# Patient Record
Sex: Male | Born: 1995 | Race: White | Hispanic: No | Smoking: Never smoker
Health system: Southern US, Community
[De-identification: ages and names within clinical notes are randomized; demographics above are authoritative.]

## PROBLEM LIST (undated history)

## (undated) DIAGNOSIS — I471 Supraventricular tachycardia, unspecified: Secondary | ICD-10-CM

## (undated) HISTORY — DX: Supraventricular tachycardia: I47.1

## (undated) HISTORY — PX: OTHER SURGICAL HISTORY: SHX169

## (undated) HISTORY — DX: Supraventricular tachycardia, unspecified: I47.10

---
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
## 2016-09-28 ENCOUNTER — Other Ambulatory Visit (HOSPITAL_COMMUNITY): Payer: Self-pay | Admitting: Internal Medicine

## 2016-09-28 DIAGNOSIS — R51 Headache: Principal | ICD-10-CM

## 2016-09-28 DIAGNOSIS — R519 Headache, unspecified: Secondary | ICD-10-CM

## 2016-10-06 ENCOUNTER — Ambulatory Visit (HOSPITAL_COMMUNITY): Payer: Self-pay

## 2018-04-18 ENCOUNTER — Other Ambulatory Visit (HOSPITAL_COMMUNITY): Payer: Self-pay | Admitting: Family Medicine

## 2018-04-18 ENCOUNTER — Ambulatory Visit (HOSPITAL_COMMUNITY)
Admission: RE | Admit: 2018-04-18 | Discharge: 2018-04-18 | Disposition: A | Discharge status: Home / Self Care | Payer: Managed Care, Other (non HMO) | Source: Ambulatory Visit | Attending: Family Medicine | Admitting: Family Medicine

## 2018-04-18 DIAGNOSIS — M542 Cervicalgia: Secondary | ICD-10-CM

## 2018-04-18 DIAGNOSIS — Z1389 Encounter for screening for other disorder: Secondary | ICD-10-CM | POA: Insufficient documentation

## 2018-04-18 DIAGNOSIS — K589 Irritable bowel syndrome without diarrhea: Secondary | ICD-10-CM | POA: Diagnosis present

## 2018-06-11 ENCOUNTER — Ambulatory Visit: Payer: Managed Care, Other (non HMO) | Admitting: Physician Assistant

## 2018-06-12 ENCOUNTER — Ambulatory Visit: Payer: Managed Care, Other (non HMO) | Admitting: Gastroenterology

## 2018-06-22 ENCOUNTER — Ambulatory Visit: Payer: Managed Care, Other (non HMO) | Admitting: Nurse Practitioner

## 2018-06-22 ENCOUNTER — Encounter (INDEPENDENT_AMBULATORY_CARE_PROVIDER_SITE_OTHER): Payer: Self-pay

## 2018-06-22 ENCOUNTER — Encounter: Payer: Self-pay | Admitting: Nurse Practitioner

## 2018-06-22 VITALS — BP 90/60 | HR 86 | Ht 70.5 in | Wt 124.0 lb

## 2018-06-22 DIAGNOSIS — R110 Nausea: Secondary | ICD-10-CM | POA: Diagnosis not present

## 2018-06-22 MED ORDER — ONDANSETRON HCL 4 MG PO TABS: 4.0000 mg | ORAL_TABLET | Freq: Three times a day (TID) | ORAL | 1 refills | Status: DC | PRN

## 2018-06-22 NOTE — Progress Notes (Addendum)
Chief Complaint: chronic nausea  Referring Provider:  Audrea Muscat, MD      ASSESSMENT AND PLAN;   1. 22 yo male with chronic ( 5 years) of nausea. EGD at [facility] in 2015 revealing of bile in stomach and chronic gastropathy. He has early satiety without weight loss but also unable to gain.  -Will arrange for gastric emptying scan to evaluate for gastroparesis.  -trial of zofran 4 mg as needed.  -If scan negative then consider repeating EGD, it has been several years since last one.   2. [facility] of celiac disease. Patient says PCP checked him for celiac and it was negative.  -he will get copy of celiac labs from PCP   Addendum: Records reviewed from Dr. Assunta Found.  Office visit 04/18/18 for abdominal pain. Patient felt to have irritable bowel syndrome.Liver test unremarkable.  Lipase unremarkable.  Referred to GI  HPI:    Patient is a 22 year old male with a hx of SVT, s/p ablation in 2015 at [facility]. No cardiac issues since. Patient here with his mother for evaluation of nausea at request of PCP.     Patient gives a history of chronic nausea, since age 55. Never vomits. Feels best in am upon rising then starts to get nauseated when up moving around. Nausea increases with PO intake. Soft drinks make the nausea worse as does [place] food. Other than that it doesn't matter what he eats. Has never been able to gain weight. Can't complete meals due to early satiety. No abdominal pain but he does burp a lot. No heartburn. BMs are at baseline. No rectal bleeding. He doesn't take any medication, prescription or OTC. He doesn't smoke, no ETOH. No THC. He had an EGD at [facility] in 2015 for evaluation of abdominal pain. There was bile in stomach and chronic gastropathy   Past Medical History:  Diagnosis Date  . SVT (supraventricular tachycardia) (HCC)     Past Surgical History:  Procedure Laterality Date  . heart ablation     Family History  Problem Relation Age of Onset  . Colon  cancer Paternal Aunt   . Diabetes Paternal Aunt   . Celiac disease Paternal Uncle    Social History   Tobacco Use  . Smoking status: Never Smoker  Substance Use Topics  . Alcohol use: Never    Frequency: Never  . Drug use: Never   No current outpatient medications on file.   No current facility-administered medications for this visit.    No Known Allergies   Review of Systems: All systems reviewed and negative except where noted in HPI.   Creatinine clearance cannot be calculated (No order found.)   Physical Exam:    Wt Readings from Last 3 Encounters:  06/22/18 124 lb (56.2 kg)    BP 90/60   Pulse 86   Ht 5' 10.5" (1.791 m)   Wt 124 lb (56.2 kg)   BMI 17.54 kg/m  Constitutional:  Pleasant thin male in no acute distress. Psychiatric: Normal mood and affect. Behavior is normal. EENT: Pupils normal.  Conjunctivae are normal. No scleral icterus. Neck supple.  Cardiovascular: Normal rate, regular rhythm. No edema Pulmonary/chest: Effort normal and breath sounds normal. No wheezing, rales or rhonchi. Abdominal: Soft, nondistended, nontender. Bowel sounds active throughout. There are no masses palpable. No hepatomegaly. Neurological: Alert and oriented to person place and time. Skin: Skin is warm and dry. No rashes noted.  Willette Cluster, NP  06/22/2018, 2:30 PM  Cc:  Audrea MuscatJohn Golden, MD

## 2018-06-22 NOTE — Patient Instructions (Signed)
If you are age 21 or older, your body mass index should be between 23-30. Your Body mass index is 17.54 kg/m. If this is out of the aforementioned range listed, please consider follow up with your Primary Care Provider.  If you are age 21 or younger, your body mass index should be between 19-25. Your Body mass index is 17.54 kg/m. If this is out of the aformentioned range listed, please consider follow up with your Primary Care Provider.   You have been scheduled for a gastric emptying scan at [facility] on 07/04/18 at 7:30 am. Please arrive at least 15 minutes prior to your appointment for registration. Please make certain not to have anything to eat or drink after midnight the night before your test. Hold all stomach medications (ex: Zofran, phenergan, Reglan) 48 hours prior to your test. If you need to reschedule your appointment, please contact radiology scheduling at [phone]. _____________________________________________________________________ A gastric-emptying study measures how long it takes for food to move through your stomach. There are several ways to measure stomach emptying. In the most common test, you eat food that contains a small amount of radioactive material. A scanner that detects the movement of the radioactive material is placed over your abdomen to monitor the rate at which food leaves your stomach. This test normally takes about 4 hours to complete. _____________________________________________________________________  We have sent the following medications to your pharmacy for you to pick up at your convenience: Zofran  Thank you for choosing me and [facility].   Willette ClusterPaula Guenther, NP

## 2018-06-25 NOTE — Progress Notes (Signed)
Agree with assessment and plan as outlined. He may wish to take Zofran q HS and see if this helps him in the AM. Would try scheduled Zofran a few times per day to see if this helps minimize his symptoms.

## 2018-07-04 ENCOUNTER — Ambulatory Visit (HOSPITAL_COMMUNITY): Payer: Managed Care, Other (non HMO)

## 2018-07-11 ENCOUNTER — Other Ambulatory Visit: Payer: Self-pay | Admitting: Nurse Practitioner

## 2019-04-16 ENCOUNTER — Encounter: Payer: Self-pay | Admitting: Gastroenterology

## 2019-04-16 ENCOUNTER — Ambulatory Visit (INDEPENDENT_AMBULATORY_CARE_PROVIDER_SITE_OTHER): Payer: Self-pay | Admitting: Gastroenterology

## 2019-04-16 ENCOUNTER — Other Ambulatory Visit: Payer: Self-pay

## 2019-04-16 VITALS — Ht 70.0 in | Wt 124.0 lb

## 2019-04-16 DIAGNOSIS — R110 Nausea: Secondary | ICD-10-CM

## 2019-04-16 NOTE — Patient Instructions (Addendum)
We will arrange gastric emptying scan for chronic nausea.  He is at work currently and may not be able to answer his cell phone immediately but he will call back as soon as he can if a voicemail was left.  You have been scheduled for a gastric emptying scan at [facility] on 04/24/19 at 1:30pm. Please arrive at least 15 minutes prior to your appointment for registration. Please make certain not to have anything to eat or drink after midnight the night before your test. Hold all stomach medications (ex: Zofran, phenergan, Reglan) 48 hours prior to your test. If you need to reschedule your appointment, please contact radiology scheduling at [phone]. _____________________________________________________________________ A gastric-emptying study measures how long it takes for food to move through your stomach. There are several ways to measure stomach emptying. In the most common test, you eat food that contains a small amount of radioactive material. A scanner that detects the movement of the radioactive material is placed over your abdomen to monitor the rate at which food leaves your stomach. This test normally takes about 4 hours to complete. _____________________________________________________________________

## 2019-04-16 NOTE — Progress Notes (Signed)
This service was provided via virtual visit.  Both audio and visual were used.  The patient was located at home.  I was located in my office.  The patient did consent to this virtual visit and is aware of possible charges through their insurance for this visit.  The patient is an established patient.  My certified medical assistant, Barbaraann Rondo, contributed to this visit by contacting the patient by phone 1 or 2 business days prior to the appointment and also followed up on the recommendations I made after the visit.  Time spent on virtual visit: 24 min   HPI: This is a very pleasant 23 year old man whom I am meeting for the first time today.  This is via telemedicine visit.   EGD at [facility] in 2015 for evaluation of abdominal pain. There was bile in stomach and chronic gastropathy   He was seen here in our office July 2019 by Willette Cluster, staffed by Dr. Adela Lank.  This was for chronic nausea that has been going on for at least 6 years.  She recommended and ordered a gastric emptying scan.  Their plan was to proceed with EGD if the gastric emptying scan was unhelpful.  He never underwent the gastric emptying scan.    He is still having problems with nausea, never vomits.  Sometimes this occurs with eating sometimes not with eating.  Can   Only very rarely drinks etoh.  Overall his weight has been stable.  Very rarely needs NSAIDs, 1-2 times per month.  He's been on several different meds for his stomach, doesn't recall any of the names.  His uncle has celiac.  He was himself tested for celiac by blood tests  No DM.  Not on illicit drugs.  No abdominal pains at all.  He has no real issues with his bowels, does have to strain at times but this seems to be fairly minor.  Chief complaint is chronic nausea  ROS: complete GI ROS as described in HPI, all other review negative.  Constitutional:  No unintentional weight loss   Past Medical History:  Diagnosis Date  . SVT  (supraventricular tachycardia) (HCC)     Past Surgical History:  Procedure Laterality Date  . heart ablation      No current outpatient medications on file.   No current facility-administered medications for this visit.     Allergies as of 04/16/2019  . (No Known Allergies)    Family History  Problem Relation Age of Onset  . Colon cancer Paternal Aunt   . Diabetes Paternal Aunt   . Celiac disease Paternal Uncle     Social History   Socioeconomic History  . Marital status: Single    Spouse name: Not on file  . Number of children: Not on file  . Years of education: Not on file  . Highest education level: Not on file  Occupational History  . Not on file  Social Needs  . Financial resource strain: Not on file  . Food insecurity:    Worry: Not on file    Inability: Not on file  . Transportation needs:    Medical: Not on file    Non-medical: Not on file  Tobacco Use  . Smoking status: Never Smoker  . Smokeless tobacco: Never Used  Substance and Sexual Activity  . Alcohol use: Never    Frequency: Never  . Drug use: Never  . Sexual activity: Not on file  Lifestyle  . Physical activity:  Days per week: Not on file    Minutes per session: Not on file  . Stress: Not on file  Relationships  . Social connections:    Talks on phone: Not on file    Gets together: Not on file    Attends religious service: Not on file    Active member of club or organization: Not on file    Attends meetings of clubs or organizations: Not on file    Relationship status: Not on file  . Intimate partner violence:    Fear of current or ex partner: Not on file    Emotionally abused: Not on file    Physically abused: Not on file    Forced sexual activity: Not on file  Other Topics Concern  . Not on file  Social History Narrative  . Not on file     Physical Exam: Unable to perform because this was a "telemed visit" due to current Covid-19 pandemic  Assessment and plan: 23 y.o.  male with chronic nausea  He has had chronic nausea for 6 or 7 years.  No abdominal pains and he only very rarely vomits.  Celiac sprue runs in his family, his uncle has it.  He tells me he was tested for by blood tests within the past year or so.  I do not see any of those results in epic or through care everywhere.  He had an upper endoscopy 5 years ago.  Bile was seen.  He was not given any real explanation of his symptoms or treatment recommendations from what he recalls however.  He has not been losing weight.  He is thin now and has always been thin.  He was seen for these exact complaints here about a year ago in our office.  He was recommended to have a gastric emptying scan however never went through with that.  I recommend the same thing for him to start his work-up now.  Hopefully he will follow through.  If it is not helpful then he will probably need repeat upper endoscopy to examine for ulcer disease, H. pylori infection, probably biopsy his upper intestine for pathologic evidence of sprue.  Please see the "Patient Instructions" section for addition details about the plan.  Rob Buntinganiel Jezlyn Westerfield, MD [facility] 04/16/2019, 10:28 AM

## 2019-04-24 ENCOUNTER — Other Ambulatory Visit: Payer: Self-pay

## 2019-04-24 ENCOUNTER — Encounter (HOSPITAL_COMMUNITY)
Admission: RE | Admit: 2019-04-24 | Discharge: 2019-04-24 | Disposition: A | Discharge status: Home / Self Care | Payer: 59 | Source: Ambulatory Visit | Attending: Gastroenterology | Admitting: Gastroenterology

## 2019-04-24 DIAGNOSIS — R110 Nausea: Secondary | ICD-10-CM | POA: Insufficient documentation

## 2019-04-24 MED ORDER — TECHNETIUM TC 99M SULFUR COLLOID: 2.1000 | Freq: Once | INTRAVENOUS | Status: DC | PRN

## 2019-04-25 ENCOUNTER — Other Ambulatory Visit: Payer: Self-pay | Admitting: Gastroenterology

## 2019-04-25 DIAGNOSIS — R110 Nausea: Secondary | ICD-10-CM

## 2019-04-30 ENCOUNTER — Encounter: Payer: Self-pay | Admitting: Gastroenterology

## 2019-05-03 ENCOUNTER — Encounter: Payer: 59 | Admitting: Gastroenterology

## 2019-05-08 ENCOUNTER — Telehealth: Payer: Self-pay | Admitting: *Deleted

## 2019-05-08 NOTE — Telephone Encounter (Signed)
LMOM will call back. 

## 2019-05-08 NOTE — Telephone Encounter (Signed)
Pt returned your call.  

## 2019-05-08 NOTE — Telephone Encounter (Signed)

## 2019-05-10 ENCOUNTER — Ambulatory Visit (AMBULATORY_SURGERY_CENTER): Payer: 59 | Admitting: Gastroenterology

## 2019-05-10 ENCOUNTER — Other Ambulatory Visit: Payer: Self-pay

## 2019-05-10 ENCOUNTER — Encounter: Payer: Self-pay | Admitting: Gastroenterology

## 2019-05-10 VITALS — BP 101/59 | HR 43 | Temp 97.7°F | Resp 18 | Ht 70.0 in | Wt 124.0 lb

## 2019-05-10 DIAGNOSIS — K2990 Gastroduodenitis, unspecified, without bleeding: Secondary | ICD-10-CM

## 2019-05-10 DIAGNOSIS — K2970 Gastritis, unspecified, without bleeding: Secondary | ICD-10-CM | POA: Diagnosis not present

## 2019-05-10 DIAGNOSIS — R110 Nausea: Secondary | ICD-10-CM

## 2019-05-10 MED ORDER — SODIUM CHLORIDE 0.9 % IV SOLN: 500.0000 mL | Freq: Once | INTRAVENOUS | Status: DC

## 2019-05-10 NOTE — Progress Notes (Signed)
PT taken to PACU. Monitors in place. VSS. Report given to RN. 

## 2019-05-10 NOTE — Progress Notes (Signed)
No problems noted in the recovery room. maw 

## 2019-05-10 NOTE — Progress Notes (Signed)
Pt's states no medical or surgical changes since previsit or office visit. 

## 2019-05-10 NOTE — Progress Notes (Signed)
[place], [place]- Vitals/temp

## 2019-05-10 NOTE — Patient Instructions (Signed)
YOU HAD AN ENDOSCOPIC PROCEDURE TODAY AT THE [facility]:   Refer to the procedure report that was given to you for any specific questions about what was found during the examination.  If the procedure report does not answer your questions, please call your gastroenterologist to clarify.  If you requested that your care partner not be given the details of your procedure findings, then the procedure report has been included in a sealed envelope for you to review at your convenience later.  YOU SHOULD EXPECT: Some feelings of bloating in the abdomen. Passage of more gas than usual.  Walking can help get rid of the air that was put into your GI tract during the procedure and reduce the bloating. If you had a lower endoscopy (such as a colonoscopy or flexible sigmoidoscopy) you may notice spotting of blood in your stool or on the toilet paper. If you underwent a bowel prep for your procedure, you may not have a normal bowel movement for a few days.  Please Note:  You might notice some irritation and congestion in your nose or some drainage.  This is from the oxygen used during your procedure.  There is no need for concern and it should clear up in a day or so.  SYMPTOMS TO REPORT IMMEDIATELY:     Following upper endoscopy (EGD)  Vomiting of blood or coffee ground material  New chest pain or pain under the shoulder blades  Painful or persistently difficult swallowing  New shortness of breath  Fever of 100F or higher  Black, tarry-looking stools  For urgent or emergent issues, a gastroenterologist can be reached at any hour by calling [phone].   DIET:  We do recommend a small meal at first, but then you may proceed to your regular diet.  Drink plenty of fluids but you should avoid alcoholic beverages for 24 hours.  ACTIVITY:  You should plan to take it easy for the rest of today and you should NOT DRIVE or use heavy machinery until tomorrow (because of the sedation medicines  used during the test).    FOLLOW UP: Our staff will call the number listed on your records 48-72 hours following your procedure to check on you and address any questions or concerns that you may have regarding the information given to you following your procedure. If we do not reach you, we will leave a message.  We will attempt to reach you two times.  During this call, we will ask if you have developed any symptoms of COVID 19. If you develop any symptoms (ie: fever, flu-like symptoms, shortness of breath, cough etc.) before then, please call [phone].  If you test positive for Covid 19 in the 2 weeks post procedure, please call and report this information to us.    If any biopsies were taken you will be contacted by phone or by letter within the next 1-3 weeks.  Please call us at [phone] if you have not heard about the biopsies in 3 weeks.    SIGNATURES/CONFIDENTIALITY: You and/or your care partner have signed paperwork which will be entered into your electronic medical record.  These signatures attest to the fact that that the information above on your After Visit Summary has been reviewed and is understood.  Full responsibility of the confidentiality of this discharge information lies with you and/or your care-partner.    Handout was given to your care partner on Gastritis. You may resume your current medications today. Await biopsy  results. Please call if any questions or concerns.

## 2019-05-10 NOTE — Op Note (Signed)
[facility] Patient Name: Jeffrey Jarvis Procedure Date: 05/10/2019 12:56 PM MRN: [id number] Endoscopist: Rachael Fee , MD Age: 23 Referring MD:  Date of Birth: [birth year] Gender: Male Account #: [id number] Procedure:                Upper GI endoscopy Indications:              Nausea Medicines:                Monitored Anesthesia Care Procedure:                Pre-Anesthesia Assessment:                           - Prior to the procedure, a History and Physical                            was performed, and patient medications and                            allergies were reviewed. The patient's tolerance of                            previous anesthesia was also reviewed. The risks                            and benefits of the procedure and the sedation                            options and risks were discussed with the patient.                            All questions were answered, and informed consent                            was obtained. Prior Anticoagulants: The patient has                            taken no previous anticoagulant or antiplatelet                            agents. ASA Grade Assessment: I - A normal, healthy                            patient. After reviewing the risks and benefits,                            the patient was deemed in satisfactory condition to                            undergo the procedure.                           After obtaining informed consent, the endoscope was  passed under direct vision. Throughout the                            procedure, the patient's blood pressure, pulse, and                            oxygen saturations were monitored continuously. The                            Endoscope was introduced through the mouth, and                            advanced to the second part of duodenum. The upper                            GI endoscopy was accomplished without difficulty.                  The patient tolerated the procedure well. Scope In: Scope Out: Findings:                 Mild inflammation characterized by erythema and                            friability was found in the gastric antrum.                            Biopsies were taken with a cold forceps for                            histology.                           The exam was otherwise without abnormality.                           The duodenum was biopsied and sent to path. Complications:            No immediate complications. Estimated blood loss:                            None. Estimated Blood Loss:     Estimated blood loss: none. Impression:               - Gastritis. Biopsied to check for H. pylori.                           - The examination was otherwise normal.                           - The normal appearing duodenum was biopsied to                            check for Celiac Sprue. Recommendation:           - Patient has a contact number available for  emergencies. The signs and symptoms of potential                            delayed complications were discussed with the                            patient. Return to normal activities tomorrow.                            Written discharge instructions were provided to the                            patient.                           - Resume previous diet.                           - Continue present medications.                           - Await pathology results. Would consider                            endocrinology referral. Rachael Feeaniel P Jacobs, MD 05/10/2019 1:10:35 PM This report has been signed electronically.

## 2019-05-14 ENCOUNTER — Telehealth: Payer: Self-pay

## 2019-05-14 NOTE — Telephone Encounter (Signed)
Left message on 2nd follow up call. 

## 2019-05-14 NOTE — Telephone Encounter (Signed)
Left message on follow up call. 

## 2019-05-20 ENCOUNTER — Other Ambulatory Visit: Payer: Self-pay

## 2019-05-20 MED ORDER — ONDANSETRON HCL 4 MG PO TABS: 4.0000 mg | ORAL_TABLET | Freq: Two times a day (BID) | ORAL | 11 refills | Status: DC

## 2019-07-01 ENCOUNTER — Telehealth: Payer: Self-pay | Admitting: General Surgery

## 2019-07-01 NOTE — Telephone Encounter (Signed)
error 

## 2019-07-01 NOTE — Telephone Encounter (Signed)
Called the patient to Covid screen for in person visit on 07/02/2019. The patient stated he needed to cancel this appointment and he will call back to reschedule.

## 2019-07-02 ENCOUNTER — Ambulatory Visit: Payer: 59 | Admitting: Gastroenterology

## 2019-12-19 ENCOUNTER — Ambulatory Visit: Payer: 59 | Attending: Internal Medicine

## 2019-12-19 ENCOUNTER — Other Ambulatory Visit: Payer: Self-pay

## 2019-12-19 DIAGNOSIS — Z20822 Contact with and (suspected) exposure to covid-19: Secondary | ICD-10-CM | POA: Diagnosis not present

## 2019-12-21 LAB — NOVEL CORONAVIRUS, NAA: SARS-CoV-2, NAA: NOT DETECTED

## 2020-04-29 DIAGNOSIS — J343 Hypertrophy of nasal turbinates: Secondary | ICD-10-CM | POA: Diagnosis not present

## 2020-04-29 DIAGNOSIS — Z8719 Personal history of other diseases of the digestive system: Secondary | ICD-10-CM | POA: Diagnosis not present

## 2020-06-04 DIAGNOSIS — R609 Edema, unspecified: Secondary | ICD-10-CM | POA: Diagnosis not present

## 2020-06-12 DIAGNOSIS — K1120 Sialoadenitis, unspecified: Secondary | ICD-10-CM | POA: Diagnosis not present

## 2020-06-12 DIAGNOSIS — K115 Sialolithiasis: Secondary | ICD-10-CM | POA: Diagnosis not present

## 2020-06-12 DIAGNOSIS — R221 Localized swelling, mass and lump, neck: Secondary | ICD-10-CM | POA: Diagnosis not present

## 2020-07-03 DIAGNOSIS — K115 Sialolithiasis: Secondary | ICD-10-CM | POA: Diagnosis not present

## 2020-09-17 ENCOUNTER — Other Ambulatory Visit: Payer: Self-pay

## 2020-09-17 ENCOUNTER — Other Ambulatory Visit: Payer: 59

## 2020-09-17 DIAGNOSIS — Z20822 Contact with and (suspected) exposure to covid-19: Secondary | ICD-10-CM | POA: Diagnosis not present

## 2020-09-18 LAB — SPECIMEN STATUS REPORT

## 2020-09-18 LAB — SARS-COV-2, NAA 2 DAY TAT

## 2020-09-18 LAB — NOVEL CORONAVIRUS, NAA: SARS-CoV-2, NAA: NOT DETECTED

## 2021-02-19 DIAGNOSIS — R2241 Localized swelling, mass and lump, right lower limb: Secondary | ICD-10-CM | POA: Diagnosis not present

## 2021-02-19 DIAGNOSIS — M67471 Ganglion, right ankle and foot: Secondary | ICD-10-CM | POA: Diagnosis not present

## 2021-05-06 ENCOUNTER — Other Ambulatory Visit: Payer: Self-pay

## 2021-05-06 ENCOUNTER — Ambulatory Visit
Admission: EM | Admit: 2021-05-06 | Discharge: 2021-05-06 | Disposition: A | Discharge status: Home / Self Care | Payer: 59 | Attending: Internal Medicine | Admitting: Internal Medicine

## 2021-05-06 DIAGNOSIS — T148XXA Other injury of unspecified body region, initial encounter: Secondary | ICD-10-CM

## 2021-05-06 DIAGNOSIS — Z23 Encounter for immunization: Secondary | ICD-10-CM

## 2021-05-06 MED ORDER — CIPROFLOXACIN HCL 500 MG PO TABS: 500.0000 mg | ORAL_TABLET | Freq: Two times a day (BID) | ORAL | 0 refills | Status: DC

## 2021-05-06 MED ORDER — TETANUS-DIPHTH-ACELL PERTUSSIS 5-2.5-18.5 LF-MCG/0.5 IM SUSY
0.5000 mL | PREFILLED_SYRINGE | Freq: Once | INTRAMUSCULAR | Status: AC
  Administered 2021-05-06: 0.5 mL via INTRAMUSCULAR

## 2021-05-06 NOTE — ED Triage Notes (Signed)
Stepped on nail with left foot today.  Unsure of last tetanus shot.

## 2021-05-06 NOTE — ED Provider Notes (Signed)
[facility]    CSN: [id number] Arrival date & time: 05/06/21  2458      History   Chief Complaint No chief complaint on file.   HPI Jeffrey Jarvis is a 25 y.o. male who stepped on a rusted nail while at home and caused a lot of bleeding of L foot. Does not recall when his last TD was. 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 He was wearing work boots. He washed it at home in salt water.     Past Medical History:  Diagnosis Date  . SVT (supraventricular tachycardia) (HCC)     There are no problems to display for this patient.   Past Surgical History:  Procedure Laterality Date  . heart ablation         Home Medications    Prior to Admission medications   Medication Sig Start Date End Date Taking? Authorizing Provider  ciprofloxacin (CIPRO) 500 MG tablet Take 1 tablet (500 mg total) by mouth 2 (two) times daily. 05/06/21  Yes Rodriguez-Southworth, Nettie Elm, PA-C    Family History Family History  Problem Relation Age of Onset  . Colon cancer Paternal Aunt   . Diabetes Paternal Aunt   . Celiac disease Paternal Uncle     Social History Social History   Tobacco Use  . Smoking status: Never Smoker  . Smokeless tobacco: Never Used  Vaping Use  . Vaping Use: Never used  Substance Use Topics  . Alcohol use: Yes    Comment: occ  . Drug use: Never     Allergies   Patient has no known allergies.   Review of Systems Review of Systems  Musculoskeletal: Positive for gait problem. Negative for arthralgias.  Skin: Positive for wound. Negative for color change, pallor and rash.  Neurological: Negative for numbness.     Physical Exam Triage Vital Signs ED Triage Vitals  Enc Vitals Group     BP 05/06/21 1849 118/66     Pulse Rate 05/06/21 1849 90     Resp 05/06/21 1849 16     Temp 05/06/21 1849 97.7 F (36.5 C)     Temp Source 05/06/21 1849 Oral     SpO2 05/06/21 1849 96 %     Weight --       Height --      Head Circumference --      Peak Flow --      Pain Score 05/06/21 1850 3     Pain Loc --      Pain Edu? --      Excl. in GC? --    No data found.  Updated Vital Signs BP 118/66 (BP Location: Right Arm)   Pulse 90   Temp 97.7 F (36.5 C) (Oral)   Resp 16   SpO2 96%   Visual Acuity Right Eye Distance:   Left Eye Distance:   Bilateral Distance:    Right Eye Near:   Left Eye Near:    Bilateral Near:     Physical Exam Vitals and nursing note reviewed.  Constitutional:      General: He is not in acute distress.    Appearance: He is normal weight. He is not toxic-appearing.  HENT:     Right Ear: External ear normal.     Left Ear: External ear normal.  Eyes:     General: No scleral icterus.    Conjunctiva/sclera: Conjunctivae normal.  Pulmonary:     Effort: Pulmonary effort is normal.  Musculoskeletal:        General: No swelling. Normal range of motion.     Cervical back: Neck supple.  Skin:    General: Skin is warm and dry.     Comments: L FOOT - with clean puncture wound below great toe ball of foot. Does not have bone tenderness   Neurological:     Mental Status: He is alert and oriented to person, place, and time.     Gait: Gait abnormal.  Psychiatric:        Mood and Affect: Mood normal.        Behavior: Behavior normal.        Thought Content: Thought content normal.        Judgment: Judgment normal.      UC Treatments / Results  Labs (all labs ordered are listed, but only abnormal results are displayed) Labs Reviewed - No data to display  EKG   Radiology No results found.  Procedures Procedures (including critical care time)  Medications Ordered in UC Medications  Tdap (BOOSTRIX) injection 0.5 mL (0.5 mLs Intramuscular Given 05/06/21 1855)    Initial Impression / Assessment and Plan / UC Course  I have reviewed the triage vital signs and the nursing notes. TDAP given I placed him on Cipro as noted.  Advised to continue  washing foot in salt water.  I applied a band aid before he left   Final Clinical Impressions(s) / UC Diagnoses   Final diagnoses:  Puncture wound     Discharge Instructions     I am placing you on Cipro antibiotic to cover you for a bacteria called psudomonoas that can come from rubber shoes.     ED Prescriptions    Medication Sig Dispense Auth. Provider   ciprofloxacin (CIPRO) 500 MG tablet Take 1 tablet (500 mg total) by mouth 2 (two) times daily. 14 tablet Rodriguez-Southworth, Nettie Elm, PA-C     PDMP not reviewed this encounter.   Garey Ham, Cordelia Poche 05/06/21 2129

## 2021-05-06 NOTE — Discharge Instructions (Signed)
I am placing you on Cipro antibiotic to cover you for a bacteria called psudomonoas that can come from rubber shoes.

## 2021-12-03 DIAGNOSIS — J069 Acute upper respiratory infection, unspecified: Secondary | ICD-10-CM | POA: Diagnosis not present

## 2024-02-22 ENCOUNTER — Other Ambulatory Visit (HOSPITAL_COMMUNITY): Payer: Self-pay | Admitting: Internal Medicine

## 2024-02-22 DIAGNOSIS — K529 Noninfective gastroenteritis and colitis, unspecified: Secondary | ICD-10-CM

## 2024-02-22 DIAGNOSIS — R109 Unspecified abdominal pain: Secondary | ICD-10-CM

## 2024-02-26 ENCOUNTER — Ambulatory Visit (HOSPITAL_COMMUNITY)
Admission: RE | Admit: 2024-02-26 | Discharge: 2024-02-26 | Disposition: A | Discharge status: Home / Self Care | Source: Ambulatory Visit | Attending: Internal Medicine | Admitting: Internal Medicine

## 2024-02-26 ENCOUNTER — Encounter (HOSPITAL_COMMUNITY): Payer: Self-pay

## 2024-02-26 DIAGNOSIS — R109 Unspecified abdominal pain: Secondary | ICD-10-CM | POA: Diagnosis present

## 2024-02-26 DIAGNOSIS — K529 Noninfective gastroenteritis and colitis, unspecified: Secondary | ICD-10-CM | POA: Diagnosis present

## 2024-02-26 MED ORDER — IOHEXOL 300 MG/ML  SOLN
100.0000 mL | Freq: Once | INTRAMUSCULAR | Status: AC | PRN
  Administered 2024-02-26: 100 mL via INTRAVENOUS

## 2024-03-17 NOTE — Progress Notes (Deleted)
 Referring Provider: Kathyleen Parkins, MD  Primary Care Physician:  [facility] Primary Gastroenterologist:  Dr. Jenney Modest chief complaint on file.   HPI:   Jeffrey Jarvis is a 28 y.o. male presenting today at the request of Kathyleen Parkins, MD for abdominal pain and diarrhea.    Reviewed referral: OV with PCP 02/22/24 with patient reporting 2 weeks of watery diarrhea abdominal pain.  CT, stool studies ordered. Prescribed lomotil .   CT A/P with contrast 02/26/24 entirely normal.   03/01/24 E coli, giardia, cryptosporidium negative.  I see C diff test, but can't see result.   Other labs included: 05/22/23: Fecal elastase >800  05/23/23:  Celiac panel negative.  CBC wnl CMP wnl Alpha gal panel negative TSH wnl H pylori breath trest negative.    Past Medical History:  Diagnosis Date   SVT (supraventricular tachycardia) (HCC)     Past Surgical History:  Procedure Laterality Date   heart ablation      Current Outpatient Medications  Medication Sig Dispense Refill   ciprofloxacin (CIPRO) 500 MG tablet Take 1 tablet (500 mg total) by mouth 2 (two) times daily. 14 tablet 0   No current facility-administered medications for this visit.    Allergies as of 03/18/2024   (No Known Allergies)    Family History  Problem Relation Age of Onset   Colon cancer Paternal Aunt    Diabetes Paternal Aunt    Celiac disease Paternal Uncle     Social History   Socioeconomic History   Marital status: Single    Spouse name: Not on file   Number of children: Not on file   Years of education: Not on file   Highest education level: Not on file  Occupational History   Not on file  Tobacco Use   Smoking status: Never   Smokeless tobacco: Never  Vaping Use   Vaping status: Never Used  Substance and Sexual Activity   Alcohol use: Yes    Comment: occ   Drug use: Never   Sexual activity: Not on file  Other Topics Concern   Not on file  Social History  Narrative   Not on file   Social Drivers of Health   Financial Resource Strain: Not on file  Food Insecurity: Not on file  Transportation Needs: Not on file  Physical Activity: Not on file  Stress: Not on file  Social Connections: Not on file  Intimate Partner Violence: Not on file    Review of Systems: Gen: Denies any fever, chills, fatigue, weight loss, lack of appetite.  CV: Denies chest pain, heart palpitations, peripheral edema, syncope.  Resp: Denies shortness of breath at rest or with exertion. Denies wheezing or cough.  GI: Denies dysphagia or odynophagia. Denies jaundice, hematemesis, fecal incontinence. GU : Denies urinary burning, urinary frequency, urinary hesitancy MS: Denies joint pain, muscle weakness, cramps, or limitation of movement.  Derm: Denies rash, itching, dry skin Psych: Denies depression, anxiety, memory loss, and confusion Heme: Denies bruising, bleeding, and enlarged lymph nodes.  Physical Exam: There were no vitals taken for this visit. General:   Alert and oriented. Pleasant and cooperative. Well-nourished and well-developed.  Head:  Normocephalic and atraumatic. Eyes:  Without icterus, sclera clear and conjunctiva pink.  Ears:  Normal auditory acuity. Lungs:  Clear to auscultation bilaterally. No wheezes, rales, or rhonchi. No distress.  Heart:  S1, S2 present without murmurs appreciated.  Abdomen:  +BS, soft, non-tender and non-distended. No HSM noted.  No guarding or rebound. No masses appreciated.  Rectal:  Deferred  Msk:  Symmetrical without gross deformities. Normal posture. Extremities:  Without edema. Neurologic:  Alert and  oriented x4;  grossly normal neurologically. Skin:  Intact without significant lesions or rashes. Psych:  Alert and cooperative. Normal mood and affect.    Assessment:     Plan:  ***   Shana Daring, PA-C [facility] 03/18/2024

## 2024-03-18 ENCOUNTER — Ambulatory Visit: Admitting: Gastroenterology

## 2024-03-19 ENCOUNTER — Encounter (INDEPENDENT_AMBULATORY_CARE_PROVIDER_SITE_OTHER): Payer: Self-pay | Admitting: *Deleted

## 2024-03-25 NOTE — H&P (View-Only) (Signed)
 Referring Provider:  Kathyleen Parkins, MD  Primary Care Physician:  Kathyleen Parkins, MD Primary Gastroenterologist:  Dr. Riley Cheadle  Chief Complaint  Patient presents with   Diarrhea    Diarrhea all day long can't control it.     HPI:   Jeffrey Jarvis is a 28 y.o. male presenting today at the request of Kathyleen Parkins, MD for diarrhea and abdominal pain.   Reviewed referral information: Fecal elastase greater than 800 on 05/19/2023. Celiac panel negative, alpha gal panel negative, TSH, H. pylori breath test CBC, CMP within normal limits 05/19/2023. Campylobacter, E. coli Shiga toxin, Giardia, Cryptosporidium, C. difficile toxin A and B negative on 02/26/24.  BMP wnl 02/22/24.   Recent CT A/P with contrast 02/22/2024 with normal exam.   Today:  Reports chronic history of nausea dating back to childhood.   Every time he eats, he gets nauseated. Nausea throughout the day as well.  Symptoms worsening over the last few months. Starting to feel really bad after eating now with nausea. No vomiting. Feels sick before he feels full. Wakes with nausea at times too. Soft drinks or sweet candy, milk will worsen symptoms. No issues with fried foods.    Has been having some abdominal pain as well. Can be upper or lower. Not consistent. Notices it more if he hasn't eaten all day and eats a big dinner. Doesn't seems to be related to having a bowel movement. Pain goes away quickly. No specific food triggers.   No reflux symptoms. Used to take something for this. Has been at least a month or 2 since he took this. States a couple months ago, was having pretty significant reflux symptoms. Thinks he was able to eat better when he was taking something for reflux.   Intermittent dysphagia to solid foods. Not frequent. Sometimes will burp and items will come back up.   At times, he can go a couple weeks and feel ok.     Last couple of months, having diarrhea. Taking 2 Lomotil every morning and this keeps  symptoms under control. With this, may have 1 BM a day. Normal stool. Without Lomotil, would have 3-4 Bms per day. Mostly diarrhea, but sometimes solid.  Had a lot of urgency.  Episodes where he thought he would just pass gas, but ended up passing stool as well.  No brbpr or melena. Weight has been stable.   Couple episodes of nocturnal stools.   Before diarrhea, would have BMs daily to every couple of days.   Uncle and possibly sister with celiac.  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   No NSAIDs.  No recent antibiotics.   EGD 05/10/2019 for nausea: Gastritis biopsied.  Normal duodenum biopsied to check for celiac.  Pathology was benign.  GES normal in 2020.  No prior colonoscopy.    Past Medical History:  Diagnosis Date   SVT (supraventricular tachycardia) (HCC)     Past Surgical History:  Procedure Laterality Date   heart ablation      Current Outpatient Medications  Medication Sig Dispense Refill   diphenoxylate-atropine (LOMOTIL) 2.5-0.025 MG tablet Take 2 tablets by mouth 4 (four) times daily.     pantoprazole  (PROTONIX ) 40 MG tablet Take 1 tablet (40 mg total) by mouth daily before breakfast. 30 tablet 3   No current facility-administered medications for this visit.    Allergies as of 03/27/2024   (No Known Allergies)    Family History  Problem Relation Age of Onset  Colon cancer Paternal Aunt        not sure   Diabetes Paternal Aunt    Celiac disease Paternal Uncle    Inflammatory bowel disease Neg Hx     Social History   Socioeconomic History   Marital status: Single    Spouse name: Not on file   Number of children: Not on file   Years of education: Not on file   Highest education level: Not on file  Occupational History   Not on file  Tobacco Use   Smoking status: Never   Smokeless tobacco: Never  Vaping Use   Vaping status: Never Used  Substance and Sexual Activity   Alcohol use: Yes    Comment: occ   Drug use: Never   Sexual  activity: Not on file  Other Topics Concern   Not on file  Social History Narrative   Not on file   Social Drivers of Health   Financial Resource Strain: Not on file  Food Insecurity: Not on file  Transportation Needs: Not on file  Physical Activity: Not on file  Stress: Not on file  Social Connections: Not on file  Intimate Partner Violence: Not on file    Review of Systems: Gen: Denies any fever, chills, cold or flulike symptoms, presyncope, syncope. CV: Denies chest pain, heart palpitations. Resp: Denies shortness of breath, cough. GI: See HPI GU : Denies urinary burning, urinary frequency, urinary hesitancy MS: Denies joint pain. Derm: Denies rash. Psych: Denies depression, anxiety. Heme: See HPI  Physical Exam: BP 113/70 (BP Location: Right Arm, Cuff Size: Small)   Pulse (!) 57   Temp 97.7 F (36.5 C) (Temporal)   Ht 5\' 9"  (1.753 m)   Wt 128 lb 12.8 oz (58.4 kg)   BMI 19.02 kg/m  General:   Alert and oriented. Pleasant and cooperative. Well-nourished and well-developed.  Head:  Normocephalic and atraumatic. Eyes:  Without icterus, sclera clear and conjunctiva pink.  Ears:  Normal auditory acuity. Lungs:  Clear to auscultation bilaterally. No wheezes, rales, or rhonchi. No distress.  Heart:  S1, S2 present without murmurs appreciated.  Abdomen:  +BS, soft, and non-distended.  Minimal TTP just above umbilicus.  No HSM noted. No guarding or rebound. No masses appreciated.  Rectal:  Deferred  Msk:  Symmetrical without gross deformities. Normal posture. Extremities:  Without edema. Neurologic:  Alert and  oriented x4;  grossly normal neurologically. Skin:  Intact without significant lesions or rashes. Psych:  Normal mood and affect.    Assessment:  28 year old male with history of SVT, chronic nausea, presenting today at request of Dr. Lewayne Records for evaluation of diarrhea, abdominal pain. Also discussed chronic nausea, dysphagia.    Diarrhea/change in bowel  habits: New onset diarrhea for the last couple of months.  Associated with significant urgency with bowel movements.  Denies BRBPR, melena, unintentional weight loss.  He has had some upper and lower abdominal pain, but denies any relation to bowel movements. No specific triggers and not specifically related to meals.  Recent stool testing with PCP for C. difficile, E. coli Shiga toxin, Giardia, Cryptosporidium, Campylobacter were all negative.  In June 2024, patient had fecal elastase greater than 800, celiac panel negative, alpha gal negative, TSH normal.  CT A/P with contrast 02/22/2024 was also normal.   Etiology of diarrhea/change in bowel habits is not clear.  Will check thyroid function, recheck celiac labs considering family history, alpha gal considering frequent ticks removed as he works outside, CRP, sed rate  for IBD. Also recommended lactose free diet or lactaid tablets. Depending on results, may need to consider colonoscopy for further evaluation as symptoms are not classic for IBS.   Chronic nausea without vomiting/epigastric abdominal pain:  Dating back to childhood, but worsening over the last few months, especially post prandially. More recently he has developed epigastric pain if hungry and and tries to eat a larger meal.  Denies early satiety reporting he is nauseous before he ever feels full. Terrible reflux a couple months ago, but resolved with course of PPI which he is no longer taking. No NSAIDs. Prior EGD in 2020 for nausea showed gastritis with benign biopsies.  Duodenal biopsies also benign.  Gastric emptying study was normal in 2020.  H. pylori breath test, celiac panel, alpha gal panel normal in June 2024.  CT A/P with contrast March 2025 with normal exam.  Etiology is not clear.  As he is having worsening of his chronic symptoms as well as new onset upper abdominal pain, I have recommended repeating an upper endoscopy to rule out gastritis, duodenitis, esophagitis, PUD.  I will  also go ahead and empirically start him on pantoprazole  40 mg daily and update labs.  If symptoms persist and EGD is unrevealing, will need to consider further biliary workup.  Dysphagia: Intermittent solid food dysphagia.  Will proceed with EGD with possible dilation for further evaluation.  Differentials include esophagitis, esophageal web, ring, stricture, EOE, esophageal dysmotility.     Plan:  CBC, CMP, IgA, TTG IgA, alpha-gal, CRP, sed rate, TSH Needs EGD +/- dilation with Dr. Riley Cheadle.  Holding off on scheduling until lab testing is back in the instance the patient also needs a colonoscopy.  Lactose-free diet for 3 Lactaid tablets prior to dairy consumption. OK to continue Lomotil as prescribed by PCP for now.  Start pantoprazole  40 mg daily. Follow-up in office after procedures.   Shana Daring, PA-C [facility] 03/27/2024

## 2024-03-25 NOTE — Progress Notes (Unsigned)
 Referring Provider:  Kathyleen Parkins, MD  Primary Care Physician:  [facility] Primary Gastroenterologist:  Dr. Jenney Modest chief complaint on file.   HPI:   Jeffrey Jarvis is a 28 y.o. male presenting today at the request of Kathyleen Parkins, MD for diarrhea and abdominal pain.   Reviewed referral information: Fecal elastase greater than 800 on 05/19/2023. Celiac panel negative, alpha gal panel negative, TSH, H. pylori breath test CBC, CMP within normal limits 05/19/2023. Campylobacter, E. coli Shiga toxin, Giardia, Cryptosporidium, C. difficile toxin A and B negative on 02/26/24.  BMP wnl 02/22/24.    Recent CT A/P with contrast 02/22/2024 with normal exam.   Today:    EGD 05/10/2019 for nausea: Gastritis biopsied.  Normal duodenum biopsied to check for celiac.  Pathology was benign.  Past Medical History:  Diagnosis Date   SVT (supraventricular tachycardia) (HCC)     Past Surgical History:  Procedure Laterality Date   heart ablation      Current Outpatient Medications  Medication Sig Dispense Refill   ciprofloxacin  (CIPRO ) 500 MG tablet Take 1 tablet (500 mg total) by mouth 2 (two) times daily. 14 tablet 0   No current facility-administered medications for this visit.    Allergies as of 03/27/2024   (No Known Allergies)    Family History  Problem Relation Age of Onset   Colon cancer Paternal Aunt    Diabetes Paternal Aunt    Celiac disease Paternal Uncle     Social History   Socioeconomic History   Marital status: Single    Spouse name: Not on file   Number of children: Not on file   Years of education: Not on file   Highest education level: Not on file  Occupational History   Not on file  Tobacco Use   Smoking status: Never   Smokeless tobacco: Never  Vaping Use   Vaping status: Never Used  Substance and Sexual Activity   Alcohol use: Yes    Comment: occ   Drug use: Never   Sexual activity: Not on file  Other Topics Concern    Not on file  Social History Narrative   Not on file   Social Drivers of Health   Financial Resource Strain: Not on file  Food Insecurity: Not on file  Transportation Needs: Not on file  Physical Activity: Not on file  Stress: Not on file  Social Connections: Not on file  Intimate Partner Violence: Not on file    Review of Systems: Gen: Denies any fever, chills, fatigue, weight loss, lack of appetite.  CV: Denies chest pain, heart palpitations, peripheral edema, syncope.  Resp: Denies shortness of breath at rest or with exertion. Denies wheezing or cough.  GI: Denies dysphagia or odynophagia. Denies jaundice, hematemesis, fecal incontinence. GU : Denies urinary burning, urinary frequency, urinary hesitancy MS: Denies joint pain, muscle weakness, cramps, or limitation of movement.  Derm: Denies rash, itching, dry skin Psych: Denies depression, anxiety, memory loss, and confusion Heme: Denies bruising, bleeding, and enlarged lymph nodes.  Physical Exam: There were no vitals taken for this visit. General:   Alert and oriented. Pleasant and cooperative. Well-nourished and well-developed.  Head:  Normocephalic and atraumatic. Eyes:  Without icterus, sclera clear and conjunctiva pink.  Ears:  Normal auditory acuity. Lungs:  Clear to auscultation bilaterally. No wheezes, rales, or rhonchi. No distress.  Heart:  S1, S2 present without murmurs appreciated.  Abdomen:  +BS, soft, non-tender and non-distended. No HSM noted.  No guarding or rebound. No masses appreciated.  Rectal:  Deferred  Msk:  Symmetrical without gross deformities. Normal posture. Extremities:  Without edema. Neurologic:  Alert and  oriented x4;  grossly normal neurologically. Skin:  Intact without significant lesions or rashes. Psych:  Alert and cooperative. Normal mood and affect.    Assessment:     Plan:  ***   Shana Daring, PA-C [facility] 03/27/2024

## 2024-03-27 ENCOUNTER — Encounter: Payer: Self-pay | Admitting: Gastroenterology

## 2024-03-27 ENCOUNTER — Ambulatory Visit (INDEPENDENT_AMBULATORY_CARE_PROVIDER_SITE_OTHER): Admitting: Gastroenterology

## 2024-03-27 VITALS — BP 113/70 | HR 57 | Temp 97.7°F | Ht 69.0 in | Wt 128.8 lb

## 2024-03-27 DIAGNOSIS — R110 Nausea: Secondary | ICD-10-CM | POA: Insufficient documentation

## 2024-03-27 DIAGNOSIS — R197 Diarrhea, unspecified: Secondary | ICD-10-CM | POA: Diagnosis not present

## 2024-03-27 DIAGNOSIS — R1030 Lower abdominal pain, unspecified: Secondary | ICD-10-CM | POA: Diagnosis not present

## 2024-03-27 DIAGNOSIS — R1310 Dysphagia, unspecified: Secondary | ICD-10-CM | POA: Insufficient documentation

## 2024-03-27 DIAGNOSIS — R1013 Epigastric pain: Secondary | ICD-10-CM

## 2024-03-27 DIAGNOSIS — R1010 Upper abdominal pain, unspecified: Secondary | ICD-10-CM

## 2024-03-27 MED ORDER — PANTOPRAZOLE SODIUM 40 MG PO TBEC: 40.0000 mg | DELAYED_RELEASE_TABLET | Freq: Every day | ORAL | 3 refills | Status: DC

## 2024-03-27 NOTE — Patient Instructions (Addendum)
 Please have labs completed at [facility].  [address], [place], [place] [address].  I am not able to find the lactose tolerance test. If I can find a specific test for this, I will let you know.   You can try following a strict lactose free diet or taking 3 Lactaid tablets for any dairy consumption or if you may be eating something that you think may have been cooked with some sort of dairy product.   Start Pantoprazole  40 mg daily.   We will plan for an upper endoscopy and possibly a colonoscopy pending your lab results.   Shana Daring, PA-C [facility]

## 2024-03-31 LAB — COMPLETE METABOLIC PANEL WITHOUT GFR
AG Ratio: 2 (calc) (ref 1.0–2.5)
ALT: 14 U/L (ref 9–46)
AST: 16 U/L (ref 10–40)
Albumin: 4.7 g/dL (ref 3.6–5.1)
Alkaline phosphatase (APISO): 72 U/L (ref 36–130)
BUN: 13 mg/dL (ref 7–25)
CO2: 29 mmol/L (ref 20–32)
Calcium: 9.8 mg/dL (ref 8.6–10.3)
Chloride: 104 mmol/L (ref 98–110)
Creat: 0.82 mg/dL (ref 0.60–1.24)
Globulin: 2.4 g/dL (ref 1.9–3.7)
Glucose, Bld: 84 mg/dL (ref 65–99)
Potassium: 4.1 mmol/L (ref 3.5–5.3)
Sodium: 141 mmol/L (ref 135–146)
Total Bilirubin: 0.5 mg/dL (ref 0.2–1.2)
Total Protein: 7.1 g/dL (ref 6.1–8.1)

## 2024-03-31 LAB — ALPHA-GAL PANEL
Allergen, Mutton, f88: <0.1 kU/L
Allergen, Pork, f26: <0.1 kU/L
Beef: <0.1 kU/L
CLASS: 0
CLASS: 0
Class: 0
GALACTOSE-ALPHA-1,3-GALACTOSE IGE*: <0.1 kU/L (ref <0.10)

## 2024-03-31 LAB — CBC WITH DIFFERENTIAL/PLATELET
Absolute Lymphocytes: 1362 {cells}/uL (ref 850–3900)
Absolute Monocytes: 392 {cells}/uL (ref 200–950)
Basophils Absolute: 10 {cells}/uL (ref 0–200)
Basophils Relative: 0.2 %
Eosinophils Absolute: 123 {cells}/uL (ref 15–500)
Eosinophils Relative: 2.5 %
HCT: 47.8 % (ref 38.5–50.0)
Hemoglobin: 16.2 g/dL (ref 13.2–17.1)
MCH: 29.7 pg (ref 27.0–33.0)
MCHC: 33.9 g/dL (ref 32.0–36.0)
MCV: 87.7 fL (ref 80.0–100.0)
MPV: 10.9 fL (ref 7.5–12.5)
Monocytes Relative: 8 %
Neutro Abs: 3014 {cells}/uL (ref 1500–7800)
Neutrophils Relative %: 61.5 %
Platelets: 157 10*3/uL (ref 140–400)
RBC: 5.45 10*6/uL (ref 4.20–5.80)
RDW: 12.2 % (ref 11.0–15.0)
Total Lymphocyte: 27.8 %
WBC: 4.9 10*3/uL (ref 3.8–10.8)

## 2024-03-31 LAB — TISSUE TRANSGLUTAMINASE, IGA: (tTG) Ab, IgA: <1 U/mL

## 2024-03-31 LAB — C-REACTIVE PROTEIN: CRP: <3 mg/L (ref <8.0)

## 2024-03-31 LAB — IGA: Immunoglobulin A: 232 mg/dL (ref 47–310)

## 2024-03-31 LAB — SEDIMENTATION RATE: Sed Rate: 6 mm/h (ref 0–15)

## 2024-03-31 LAB — INTERPRETATION:

## 2024-03-31 LAB — TSH: TSH: 1.63 m[IU]/L (ref 0.40–4.50)

## 2024-04-02 MED ORDER — PEG 3350-KCL-NA BICARB-NACL 420 G PO SOLR: 4000.0000 mL | Freq: Once | ORAL | 0 refills | Status: AC

## 2024-04-02 NOTE — Telephone Encounter (Signed)
Per [facility] for PA "Notification/Precertification Requirement This member's plan does not currently require notification or prior-authorization through the [employer] Notification or Prior-Authorization Program. Please contact a Customer Care Professional at [phone] if you believe the information returned to be in error."

## 2024-04-08 ENCOUNTER — Encounter (HOSPITAL_COMMUNITY): Payer: Self-pay | Admitting: Internal Medicine

## 2024-04-08 ENCOUNTER — Ambulatory Visit (HOSPITAL_BASED_OUTPATIENT_CLINIC_OR_DEPARTMENT_OTHER): Admitting: Anesthesiology

## 2024-04-08 ENCOUNTER — Ambulatory Visit (HOSPITAL_COMMUNITY)
Admission: RE | Admit: 2024-04-08 | Discharge: 2024-04-08 | Disposition: A | Discharge status: Home / Self Care | Attending: Internal Medicine | Admitting: Internal Medicine

## 2024-04-08 ENCOUNTER — Encounter (HOSPITAL_COMMUNITY)
Admission: RE | Disposition: A | Discharge status: Home / Self Care | Payer: Self-pay | Source: Home / Self Care | Attending: Internal Medicine

## 2024-04-08 ENCOUNTER — Ambulatory Visit (HOSPITAL_COMMUNITY): Admitting: Anesthesiology

## 2024-04-08 ENCOUNTER — Other Ambulatory Visit: Payer: Self-pay

## 2024-04-08 DIAGNOSIS — Z833 Family history of diabetes mellitus: Secondary | ICD-10-CM | POA: Diagnosis not present

## 2024-04-08 DIAGNOSIS — Z8719 Personal history of other diseases of the digestive system: Secondary | ICD-10-CM | POA: Insufficient documentation

## 2024-04-08 DIAGNOSIS — R1310 Dysphagia, unspecified: Secondary | ICD-10-CM | POA: Insufficient documentation

## 2024-04-08 DIAGNOSIS — R109 Unspecified abdominal pain: Secondary | ICD-10-CM | POA: Diagnosis not present

## 2024-04-08 DIAGNOSIS — Z800 Family history of malignant neoplasm of digestive organs: Secondary | ICD-10-CM | POA: Diagnosis not present

## 2024-04-08 DIAGNOSIS — Z8379 Family history of other diseases of the digestive system: Secondary | ICD-10-CM | POA: Diagnosis not present

## 2024-04-08 DIAGNOSIS — R112 Nausea with vomiting, unspecified: Secondary | ICD-10-CM | POA: Diagnosis not present

## 2024-04-08 DIAGNOSIS — K319 Disease of stomach and duodenum, unspecified: Secondary | ICD-10-CM | POA: Insufficient documentation

## 2024-04-08 DIAGNOSIS — Z79899 Other long term (current) drug therapy: Secondary | ICD-10-CM | POA: Insufficient documentation

## 2024-04-08 DIAGNOSIS — K529 Noninfective gastroenteritis and colitis, unspecified: Secondary | ICD-10-CM | POA: Diagnosis present

## 2024-04-08 HISTORY — PX: ESOPHAGOGASTRODUODENOSCOPY: SHX5428

## 2024-04-08 HISTORY — PX: ESOPHAGEAL DILATION: SHX303

## 2024-04-08 HISTORY — PX: COLONOSCOPY: SHX5424

## 2024-04-08 SURGERY — COLONOSCOPY
Anesthesia: General

## 2024-04-08 MED ORDER — PROPOFOL 500 MG/50ML IV EMUL
INTRAVENOUS | Status: DC | PRN
  Administered 2024-04-08: 250 ug/kg/min via INTRAVENOUS

## 2024-04-08 MED ORDER — PROPOFOL 10 MG/ML IV BOLUS
INTRAVENOUS | Status: DC | PRN
Start: 2024-04-08 — End: 2024-04-08
  Administered 2024-04-08: 50 mg via INTRAVENOUS
  Administered 2024-04-08: 100 mg via INTRAVENOUS
  Administered 2024-04-08: 50 mg via INTRAVENOUS

## 2024-04-08 MED ORDER — EPHEDRINE 5 MG/ML INJ
INTRAVENOUS | Status: AC
  Filled 2024-04-08: qty 5

## 2024-04-08 MED ORDER — LACTATED RINGERS IV SOLN: INTRAVENOUS | Status: DC

## 2024-04-08 MED ORDER — EPHEDRINE SULFATE-NACL 50-0.9 MG/10ML-% IV SOSY
PREFILLED_SYRINGE | INTRAVENOUS | Status: DC | PRN
  Administered 2024-04-08 (×2): 5 mg via INTRAVENOUS

## 2024-04-08 MED ORDER — DEXMEDETOMIDINE HCL IN NACL 80 MCG/20ML IV SOLN
INTRAVENOUS | Status: DC | PRN
  Administered 2024-04-08: 8 ug via INTRAVENOUS
  Administered 2024-04-08: 12 ug via INTRAVENOUS

## 2024-04-08 MED ORDER — LACTATED RINGERS IV SOLN
INTRAVENOUS | Status: DC
Start: 2024-04-08 — End: 2024-04-08

## 2024-04-08 MED ORDER — LIDOCAINE 2% (20 MG/ML) 5 ML SYRINGE
INTRAMUSCULAR | Status: DC | PRN
  Administered 2024-04-08: 100 mg via INTRAVENOUS

## 2024-04-08 NOTE — Anesthesia Preprocedure Evaluation (Signed)

## 2024-04-08 NOTE — Anesthesia Procedure Notes (Signed)
 Date/Time: 04/08/2024 12:36 PM  Performed by: Sherwin Donate, CRNAPre-anesthesia Checklist: Patient identified, Emergency Drugs available, Suction available and Patient being monitored Patient Re-evaluated:Patient Re-evaluated prior to induction Oxygen Delivery Method: Nasal cannula Induction Type: IV induction Placement Confirmation: positive ETCO2 Comments: Optiflow High Flow [place] O2 used.

## 2024-04-08 NOTE — Interval H&P Note (Signed)
 History and Physical Interval Note:  04/08/2024 12:25 PM  Jeffrey Jarvis  has presented today for surgery, with the diagnosis of diarrhea, nausea, lower and upper abdominal pain, dysphagia.  The various methods of treatment have been discussed with the patient and family. After consideration of risks, benefits and other options for treatment, the patient has consented to  Procedure(s) with comments: COLONOSCOPY (N/A) - 12:00pm, asa 2 EGD (ESOPHAGOGASTRODUODENOSCOPY) (N/A) DILATION, ESOPHAGUS (N/A) as a surgical intervention.  The patient's history has been reviewed, patient examined, no change in status, stable for surgery.  I have reviewed the patient's chart and labs.  Questions were answered to the patient's satisfaction.     Jeffrey Jarvis   no change.  He is set up here for EGD possible esophageal dilation ileocolonoscopy for chronic diarrhea.  The risks, benefits, limitations, imponderables and alternatives regarding both EGD and colonoscopy have been reviewed with the patient. Questions have been answered. All parties agreeable.

## 2024-04-08 NOTE — Interval H&P Note (Signed)
 History and Physical Interval Note:  04/08/2024 12:19 PM  Jeffrey Jarvis  has presented today for surgery, with the diagnosis of diarrhea, nausea, lower and upper abdominal pain, dysphagia.  The various methods of treatment have been discussed with the patient and family. After consideration of risks, benefits and other options for treatment, the patient has consented to  Procedure(s) with comments: COLONOSCOPY (N/A) - 12:00pm, asa 2 EGD (ESOPHAGOGASTRODUODENOSCOPY) (N/A) DILATION, ESOPHAGUS (N/A) as a surgical intervention.  The patient's history has been reviewed, patient examined, no change in status, stable for surgery.  I have reviewed the patient's chart and labs.  Questions were answered to the patient's satisfaction.     Garnette Ka  Recent labs unremarkable.  Clinical status unchanged.  Agree with need for EGD EGD as feasible/appropriate and ileocolonoscopy.  The risks, benefits, limitations, imponderables and alternatives regarding both EGD and colonoscopy have been reviewed with the patient. Questions have been answered. All parties agreeable.

## 2024-04-08 NOTE — Transfer of Care (Signed)
 Immediate Anesthesia Transfer of Care Note  Patient: Jeffrey Jarvis  Procedure(s) Performed: COLONOSCOPY EGD (ESOPHAGOGASTRODUODENOSCOPY) DILATION, ESOPHAGUS  Patient Location: Endoscopy Unit  Anesthesia Type:General  Level of Consciousness: drowsy  Airway & Oxygen Therapy: Patient Spontanous Breathing  Post-op Assessment: Report given to RN and Post -op Vital signs reviewed and stable  Post vital signs: Reviewed and stable  Last Vitals:  Vitals Value Taken Time  BP    Temp    Pulse    Resp    SpO2      Last Pain:  Vitals:   04/08/24 1236  TempSrc:   PainSc: 0-No pain      Patients Stated Pain Goal: 6 (04/08/24 1042)  Complications: No notable events documented.

## 2024-04-08 NOTE — Discharge Instructions (Addendum)
 EGD Discharge instructions Please read the instructions outlined below and refer to this sheet in the next few weeks. These discharge instructions provide you with general information on caring for yourself after you leave the hospital. Your doctor may also give you specific instructions. While your treatment has been planned according to the most current medical practices available, unavoidable complications occasionally occur. If you have any problems or questions after discharge, please call your doctor. ACTIVITY You may resume your regular activity but move at a slower pace for the next 24 hours.  Take frequent rest periods for the next 24 hours.  Walking will help expel (get rid of) the air and reduce the bloated feeling in your abdomen.  No driving for 24 hours (because of the anesthesia (medicine) used during the test).  You may shower.  Do not sign any important legal documents or operate any machinery for 24 hours (because of the anesthesia used during the test).  NUTRITION Drink plenty of fluids.  You may resume your normal diet.  Begin with a light meal and progress to your normal diet.  Avoid alcoholic beverages for 24 hours or as instructed by your caregiver.  MEDICATIONS You may resume your normal medications unless your caregiver tells you otherwise.  WHAT YOU CAN EXPECT TODAY You may experience abdominal discomfort such as a feeling of fullness or "gas" pains.  FOLLOW-UP Your doctor will discuss the results of your test with you.  SEEK IMMEDIATE MEDICAL ATTENTION IF ANY OF THE FOLLOWING OCCUR: Excessive nausea (feeling sick to your stomach) and/or vomiting.  Severe abdominal pain and distention (swelling).  Trouble swallowing.  Temperature over 101 F (37.8 C).  Rectal bleeding or vomiting of blood.     Colonoscopy Discharge Instructions  Read the instructions outlined below and refer to this sheet in the next few weeks. These discharge instructions provide you with  general information on caring for yourself after you leave the hospital. Your doctor may also give you specific instructions. While your treatment has been planned according to the most current medical practices available, unavoidable complications occasionally occur. If you have any problems or questions after discharge, call Dr. Riley Cheadle at [phone]. ACTIVITY You may resume your regular activity, but move at a slower pace for the next 24 hours.  Take frequent rest periods for the next 24 hours.  Walking will help get rid of the air and reduce the bloated feeling in your belly (abdomen).  No driving for 24 hours (because of the medicine (anesthesia) used during the test).   Do not sign any important legal documents or operate any machinery for 24 hours (because of the anesthesia used during the test).  NUTRITION Drink plenty of fluids.  You may resume your normal diet as instructed by your doctor.  Begin with a light meal and progress to your normal diet. Heavy or fried foods are harder to digest and may make you feel sick to your stomach (nauseated).  Avoid alcoholic beverages for 24 hours or as instructed.  MEDICATIONS You may resume your normal medications unless your doctor tells you otherwise.  WHAT YOU CAN EXPECT TODAY Some feelings of bloating in the abdomen.  Passage of more gas than usual.  Spotting of blood in your stool or on the toilet paper.  IF YOU HAD POLYPS REMOVED DURING THE COLONOSCOPY: No aspirin products for 7 days or as instructed.  No alcohol for 7 days or as instructed.  Eat a soft diet for the next 24 hours.  FINDING OUT THE RESULTS OF YOUR TEST Not all test results are available during your visit. If your test results are not back during the visit, make an appointment with your caregiver to find out the results. Do not assume everything is normal if you have not heard from your caregiver or the medical facility. It is important for you to follow up on all of your test  results.  SEEK IMMEDIATE MEDICAL ATTENTION IF: You have more than a spotting of blood in your stool.  Your belly is swollen (abdominal distention).  You are nauseated or vomiting.  You have a temperature over 101.  You have abdominal pain or discomfort that is severe or gets worse throughout the day.        Your upper GI tract looked good.  Your esophagus was dilated both your stomach and duodenum were biopsied   also, your colon appeared entirely normal.  Segmental biopsies performed  Further recommendations to follow pending review of the lab results in the near future  Office visit with Shana Daring in 6 weeks

## 2024-04-08 NOTE — Op Note (Signed)
 [facility] Patient Name: Jeffrey Jarvis Procedure Date: 04/08/2024 11:40 AM MRN: [id number] Date of Birth: May 20, 1996 Attending MD: Gemma Kelp , MD, [id number] CSN: [id number] Age: 28 Admit Type: Outpatient Procedure:                Upper GI endoscopy Indications:              Dysphagia, Nausea with vomiting Providers:                Gemma Kelp, MD, Crystal Page, Theola Fitch, [place] Wilson, Technician Referring MD:              Medicines:                Propofol per Anesthesia Complications:            No immediate complications. Estimated Blood Loss:     Estimated blood loss was minimal. Status post                            duodenal biopsy Procedure:                Pre-Anesthesia Assessment:                           - Prior to the procedure, a History and Physical                            was performed, and patient medications and                            allergies were reviewed. The patient's tolerance of                            previous anesthesia was also reviewed. The risks                            and benefits of the procedure and the sedation                            options and risks were discussed with the patient.                            All questions were answered, and informed consent                            was obtained. Prior Anticoagulants: The patient has                            taken no anticoagulant or antiplatelet agents. ASA                            Grade Assessment: II - A patient with mild systemic  disease. After reviewing the risks and benefits,                            the patient was deemed in satisfactory condition to                            undergo the procedure.                           After obtaining informed consent, the endoscope was                            passed under direct vision. Throughout the                            procedure, the  patient's blood pressure, pulse, and                            oxygen saturations were monitored continuously. The                            [id number]-[id number] ([id number]) scope was introduced through the                            mouth, and advanced to the second part of duodenum.                            The upper GI endoscopy was accomplished without                            difficulty. The patient tolerated the procedure                            well. Scope In: 12:41:25 PM Scope Out: 12:51:03 PM Total Procedure Duration: 0 hours 9 minutes 38 seconds  Findings:      The examined esophagus was normal.      Quite a bit of viscous bile-stained fluid in the stomach. Gastric mucosa       appeared normal. Patent pylorus.      The duodenal bulb, second portion of the duodenum and third portion of       the duodenum were normal. The scope was withdrawn. Dilation was       performed with a Maloney dilator with mild resistance at 54 Fr. The       dilation site was examined following endoscope reinsertion and showed no       change. Estimated blood loss: none. Finally, biopsies of the D2 and D3       along with the gastric antrum and body were taken for histologic study Impression:               - Normal esophagus. Dilated. Bile-stained gastric                            mucus. Status post gastric biopsy                           -  Normal duodenal bulb, second portion of the                            duodenum and third portion of the duodenum.                           - Moderate Sedation:      Moderate (conscious) sedation was personally administered by an       anesthesia professional. The following parameters were monitored: oxygen       saturation, heart rate, blood pressure, respiratory rate, EKG, adequacy       of pulmonary ventilation, and response to care. Recommendation:           - Patient has a contact number available for                            emergencies. The signs and symptoms  of potential                            delayed complications were discussed with the                            patient. Return to normal activities tomorrow.                            Written discharge instructions were provided to the                            patient.                           - Advance diet as tolerated. Follow-up on                            pathology. Office visit with Shana Daring in 6                            weeks Procedure Code(s):        --- Professional ---                           [phone], Esophagogastroduodenoscopy, flexible,                            transoral; diagnostic, including collection of                            specimen(s) by brushing or washing, when performed                            (separate procedure)                           43450, Dilation of esophagus, by unguided sound or  bougie, single or multiple passes Diagnosis Code(s):        --- Professional ---                           R13.10, Dysphagia, unspecified                           R11.2, Nausea with vomiting, unspecified CPT copyright 2022 American Medical Association. All rights reserved. The codes documented in this report are preliminary and upon coder review may  be revised to meet current compliance requirements. [name]. Deante Blough, MD Gemma Kelp, MD 04/08/2024 1:14:29 PM This report has been signed electronically. Number of Addenda: 0

## 2024-04-08 NOTE — Op Note (Signed)
 [facility] Patient Name: Jeffrey Jarvis Procedure Date: 04/08/2024 11:38 AM MRN: [id number] Date of Birth: August 24, 1996 Attending MD: Gemma Kelp , MD, [id number] CSN: [id number] Age: 27 Admit Type: Outpatient Procedure:                Colonoscopy Indications:              Chronic diarrhea Providers:                Gemma Kelp, MD, Crystal Page, [place]                            Wilson, Technician, Theola Fitch Referring MD:              Medicines:                Monitored Anesthesia Care Complications:            No immediate complications. Estimated Blood Loss:     Estimated blood loss was minimal. Procedure:                Pre-Anesthesia Assessment:                           - Prior to the procedure, a History and Physical                            was performed, and patient medications and                            allergies were reviewed. The patient's tolerance of                            previous anesthesia was also reviewed. The risks                            and benefits of the procedure and the sedation                            options and risks were discussed with the patient.                            All questions were answered, and informed consent                            was obtained. Prior Anticoagulants: The patient has                            taken no anticoagulant or antiplatelet agents. ASA                            Grade Assessment: II - A patient with mild systemic                            disease. After reviewing the risks and benefits,  the patient was deemed in satisfactory condition to                            undergo the procedure.                           After obtaining informed consent, the colonoscope                            was passed under direct vision. Throughout the                            procedure, the patient's blood pressure, pulse, and                            oxygen  saturations were monitored continuously. The                            [id number]-HQ190L ([id number]) scope was introduced through                            the anus and advanced to the the cecum, identified                            by appendiceal orifice and ileocecal valve. The                            colonoscopy was performed without difficulty. The                            patient tolerated the procedure well. The quality                            of the bowel preparation was adequate. The                            ileocecal valve, appendiceal orifice, and rectum                            were photographed. Scope In: 12:59:09 PM Scope Out: 1:11:21 PM Scope Withdrawal Time: 0 hours 7 minutes 29 seconds  Total Procedure Duration: 0 hours 12 minutes 12 seconds  Findings:      The perianal and digital rectal examinations were normal.      The colon (entire examined portion) appeared normal. Segment of biopsies       of the right left colon taken for histologic study.      The retroflexed view of the distal rectum and anal verge was normal and       showed no anal or rectal abnormalities. Ileocecal valve was somewhat       diminutive due to this and angulation I was unable to intubate the TI. Impression:               - The entire examined colon is normal. Status post  segmental biopsy.                           - The distal rectum and anal verge are normal on                            retroflexion view.                           - TI could not be intubated. Moderate Sedation:      Moderate (conscious) sedation was personally administered by an       anesthesia professional. The following parameters were monitored: oxygen       saturation, heart rate, blood pressure, respiratory rate, EKG, adequacy       of pulmonary ventilation, and response to care. Recommendation:           - Patient has a contact number available for                             emergencies. The signs and symptoms of potential                            delayed complications were discussed with the                            patient. Return to normal activities tomorrow.                            Written discharge instructions were provided to the                            patient.                           - Advance diet as tolerated.                           - Continue present medications.                           - Repeat colonoscopy at age 17 for screening                            purposes.                           - Return to GI office in 6 weeks. See EGD report. Procedure Code(s):        --- Professional ---                           [phone], Colonoscopy, flexible; diagnostic, including                            collection of specimen(s) by brushing or washing,  when performed (separate procedure) Diagnosis Code(s):        --- Professional ---                           K52.9, Noninfective gastroenteritis and colitis,                            unspecified CPT copyright 2022 American Medical Association. All rights reserved. The codes documented in this report are preliminary and upon coder review may  be revised to meet current compliance requirements. [name]. [name], MD Gemma Kelp, MD 04/08/2024 1:17:39 PM This report has been signed electronically. Number of Addenda: 0

## 2024-04-09 ENCOUNTER — Encounter (HOSPITAL_COMMUNITY): Payer: Self-pay | Admitting: Internal Medicine

## 2024-04-09 ENCOUNTER — Telehealth: Payer: Self-pay | Admitting: *Deleted

## 2024-04-09 DIAGNOSIS — Z0289 Encounter for other administrative examinations: Secondary | ICD-10-CM

## 2024-04-09 NOTE — Telephone Encounter (Signed)
 Pt dropped of FMLA papers to be filled out and signed. Placed them on providers desk.

## 2024-04-09 NOTE — Anesthesia Postprocedure Evaluation (Signed)
 Anesthesia Post Note  Patient: Jeffrey Jarvis  Procedure(s) Performed: COLONOSCOPY EGD (ESOPHAGOGASTRODUODENOSCOPY) DILATION, ESOPHAGUS  Patient location during evaluation: Phase II Anesthesia Type: General Level of consciousness: awake Pain management: pain level controlled Vital Signs Assessment: post-procedure vital signs reviewed and stable Respiratory status: spontaneous breathing and respiratory function stable Cardiovascular status: blood pressure returned to baseline and stable Postop Assessment: no headache and no apparent nausea or vomiting Anesthetic complications: no Comments: Late entry   No notable events documented.   Last Vitals:  Vitals:   04/08/24 1315 04/08/24 1317  BP: (!) 87/39 (!) 102/46  Pulse: (!) 54 66  Resp: (!) 21 17  Temp: 36.7 C   SpO2: 98% 98%    Last Pain:  Vitals:   04/08/24 1320  TempSrc:   PainSc: 0-No pain                 Coretha Dew

## 2024-04-10 LAB — SURGICAL PATHOLOGY

## 2024-04-16 ENCOUNTER — Telehealth: Payer: Self-pay | Admitting: *Deleted

## 2024-04-16 NOTE — Telephone Encounter (Signed)
 I will try to get this done as soon as possible. Unfortunately, I have not been able to complete it yet.

## 2024-04-16 NOTE — Telephone Encounter (Signed)
 Pt called informing about FMLA paperwork. Please advise.

## 2024-04-17 NOTE — Telephone Encounter (Signed)
 Pt read MyChart message.

## 2024-04-18 ENCOUNTER — Encounter: Payer: Self-pay | Admitting: *Deleted

## 2024-04-18 NOTE — Telephone Encounter (Signed)
 Done

## 2024-08-19 ENCOUNTER — Emergency Department (HOSPITAL_COMMUNITY)
Admission: EM | Admit: 2024-08-19 | Discharge: 2024-08-20 | Disposition: A | Discharge status: Home / Self Care | Attending: Emergency Medicine | Admitting: Emergency Medicine

## 2024-08-19 ENCOUNTER — Emergency Department (HOSPITAL_COMMUNITY)

## 2024-08-19 ENCOUNTER — Other Ambulatory Visit: Payer: Self-pay

## 2024-08-19 ENCOUNTER — Encounter (HOSPITAL_COMMUNITY): Payer: Self-pay | Admitting: Emergency Medicine

## 2024-08-19 DIAGNOSIS — R109 Unspecified abdominal pain: Secondary | ICD-10-CM | POA: Diagnosis not present

## 2024-08-19 DIAGNOSIS — R197 Diarrhea, unspecified: Secondary | ICD-10-CM | POA: Diagnosis present

## 2024-08-19 DIAGNOSIS — R110 Nausea: Secondary | ICD-10-CM | POA: Diagnosis not present

## 2024-08-19 LAB — CBC
HCT: 45.6 % (ref 39.0–52.0)
Hemoglobin: 15.6 g/dL (ref 13.0–17.0)
MCH: 30.1 pg (ref 26.0–34.0)
MCHC: 34.2 g/dL (ref 30.0–36.0)
MCV: 87.9 fL (ref 80.0–100.0)
Platelets: 168 K/uL (ref 150–400)
RBC: 5.19 MIL/uL (ref 4.22–5.81)
RDW: 11.9 % (ref 11.5–15.5)
WBC: 5.1 K/uL (ref 4.0–10.5)
nRBC: 0 % (ref 0.0–0.2)

## 2024-08-19 NOTE — ED Triage Notes (Signed)
 Pt c/o increased abd pain and diarrhea over the last couple of days.

## 2024-08-19 NOTE — ED Provider Notes (Signed)
 [facility]GENCY DE[facility] Emergency Department Provider Note MRN:  [id number]  Arrival date & time: 08/20/24     Chief Complaint   Diarrhea   History of Present Illness   Jeffrey Jarvis is a 28 y.o. year-old male with no pertinent past medical history presenting to the ED with chief complaint of diarrhea.  Worsening abdominal discomfort over the past few days with some blood in stool, also some abnormal stool that looks like worms.  Denies fever.  Has had abdominal discomfort and nausea after meals since he was a child.  Review of Systems  A thorough review of systems was obtained and all systems are negative except as noted in the HPI and PMH.   Patient's Health History    Past Medical History:  Diagnosis Date   SVT (supraventricular tachycardia) [facility])     Past Surgical History:  Procedure Laterality Date   COLONOSCOPY N/A 04/08/2024   Procedure: COLONOSCOPY;  Surgeon: Shaaron Lamar HERO, MD;  Location: [facility];  Service: Endoscopy;  Laterality: N/A;  12:00pm, asa 2   ESOPHAGEAL DILATION N/A 04/08/2024   Procedure: DILATION, ESOPHAGUS;  Surgeon: Shaaron Lamar HERO, MD;  Location: [facility] ENDO [facility];  Service: Endoscopy;  Laterality: N/A;   ESOPHAGOGASTRODUODENOSCOPY N/A 04/08/2024   Procedure: EGD (ESOPHAGOGASTRODUODENOSCOPY);  Surgeon: Shaaron Lamar HERO, MD;  Location: [facility];  Service: Endoscopy;  Laterality: N/A;   heart ablation      Family History  Problem Relation Age of Onset   Colon cancer Paternal Aunt        not sure   Diabetes Paternal Aunt    Celiac disease Paternal Uncle    Inflammatory bowel disease Neg Hx     Social History   Socioeconomic History   Marital status: Significant Other    Spouse name: Not on file   Number of children: Not on file   Years of education: Not on file   Highest education level: Not on file  Occupational History   Not on file  Tobacco Use   Smoking status: Never   Smokeless tobacco: Never  Vaping Use   Vaping  status: Never Used  Substance and Sexual Activity   Alcohol use: Yes    Comment: occ   Drug use: Never   Sexual activity: Not on file  Other Topics Concern   Not on file  Social History Narrative   Not on file   Social Drivers of Health   Financial Resource Strain: Not on file  Food Insecurity: Not on file  Transportation Needs: Not on file  Physical Activity: Not on file  Stress: Not on file  Social Connections: Not on file  Intimate Partner Violence: Not on file     Physical Exam   Vitals:   08/19/24 2239  BP: 118/80  Pulse: (!) 59  Resp: 18  Temp: 97.8 F (36.6 C)  SpO2: 99%    CONSTITUTIONAL: Well-appearing, NAD NEURO/PSYCH:  Alert and oriented x 3, no focal deficits EYES:  eyes equal and reactive ENT/NECK:  no LAD, no JVD CARDIO: Regular rate, well-perfused, normal S1 and S2 PULM:  CTAB no wheezing or rhonchi GI/GU:  non-distended, non-tender MSK/SPINE:  No gross deformities, no edema SKIN:  no rash, atraumatic   *Additional and/or pertinent findings included in MDM below  Diagnostic and Interventional Summary    EKG Interpretation Date/Time:    Ventricular Rate:    PR Interval:    QRS Duration:    QT Interval:    QTC  Calculation:   R Axis:      Text Interpretation:         Labs Reviewed  GASTROINTESTINAL PANEL BY PCR, STOOL (REPLACES STOOL CULTURE)  OVA + PARASITE EXAM  CBC  COMPREHENSIVE METABOLIC PANEL WITH GFR  LIPASE, BLOOD    CT ABDOMEN PELVIS W CONTRAST  Final Result      Medications  iohexol  (OMNIPAQUE ) 300 MG/ML solution 100 mL (100 mLs Intravenous Contrast Given 08/20/24 0035)     Procedures  /  Critical Care Procedures  ED Course and Medical Decision Making  Initial Impression and Ddx Differential diagnosis includes inflammatory bowel disease, infectious diarrhea, helminth infection.  Overall patient is well-appearing with normal vitals, soft abdomen.  Has had some recent evaluation with EGD and colonoscopy that were  overall reassuring.  Had a CT scan back in the spring that was unremarkable.  Shared decision making utilized, he has follow-up with GI tomorrow but is concerned that without labs and a CT scan they will simply order these things in the outpatient setting and delay his definitive management.  Given the worsening symptoms and blood in stool not unreasonable to obtain testing this evening to exclude anemia, IBD flare excetra.  Past medical/surgical history that increases complexity of ED encounter: None  Interpretation of Diagnostics I personally reviewed the Laboratory Testing and my interpretation is as follows: No significant blood count or electrolyte disturbance.  CT without acute process.  Patient Reassessment and Ultimate Disposition/Management     Patient doing well on reassessment with normal vitals, unable to provide adequate stool sample, will defer decision for helminth treatment to the GI doctors.  Appropriate for discharge.  Patient management required discussion with the following services or consulting groups:  None  Complexity of Problems Addressed Acute illness or injury that poses threat of life of bodily function  Additional Data Reviewed and Analyzed Further history obtained from: Further history from spouse/family member  Additional Factors Impacting ED Encounter Risk None  Ozell HERO. Theadore, MD [facility] Emergency Medicine [facility] [email or web address]  Final Clinical Impressions(s) / ED Diagnoses     ICD-10-CM   1. Diarrhea, unspecified type  R19.7       ED Discharge Orders     None        Discharge Instructions Discussed with and Provided to Patient:     Discharge Instructions      You were evaluated in the Emergency Department and after careful evaluation, we did not find any emergent condition requiring admission or further testing in the hospital.  Your exam/testing today is overall reassuring.  Keep your follow-up with  GI tomorrow to discuss your symptoms.  Please return to the Emergency Department if you experience any worsening of your condition.   Thank you for allowing us  to be a part of your care.       Theadore Ozell HERO, MD 08/20/24 [phone]

## 2024-08-20 ENCOUNTER — Ambulatory Visit: Admitting: Internal Medicine

## 2024-08-20 ENCOUNTER — Encounter: Payer: Self-pay | Admitting: Internal Medicine

## 2024-08-20 VITALS — BP 106/67 | HR 52 | Temp 97.4°F | Ht 70.0 in | Wt 129.8 lb

## 2024-08-20 DIAGNOSIS — R110 Nausea: Secondary | ICD-10-CM | POA: Diagnosis not present

## 2024-08-20 DIAGNOSIS — R1030 Lower abdominal pain, unspecified: Secondary | ICD-10-CM

## 2024-08-20 DIAGNOSIS — R1010 Upper abdominal pain, unspecified: Secondary | ICD-10-CM

## 2024-08-20 DIAGNOSIS — R197 Diarrhea, unspecified: Secondary | ICD-10-CM | POA: Diagnosis not present

## 2024-08-20 LAB — COMPREHENSIVE METABOLIC PANEL WITH GFR
ALT: 17 U/L (ref 0–44)
AST: 16 U/L (ref 15–41)
Albumin: 4.2 g/dL (ref 3.5–5.0)
Alkaline Phosphatase: 65 U/L (ref 38–126)
Anion gap: 10 (ref 5–15)
BUN: 14 mg/dL (ref 6–20)
CO2: 27 mmol/L (ref 22–32)
Calcium: 9.1 mg/dL (ref 8.9–10.3)
Chloride: 104 mmol/L (ref 98–111)
Creatinine, Ser: 0.85 mg/dL (ref 0.61–1.24)
GFR, Estimated: >60 mL/min (ref >60)
Glucose, Bld: 99 mg/dL (ref 70–99)
Potassium: 3.8 mmol/L (ref 3.5–5.1)
Sodium: 141 mmol/L (ref 135–145)
Total Bilirubin: 0.8 mg/dL (ref 0.0–1.2)
Total Protein: 6.9 g/dL (ref 6.5–8.1)

## 2024-08-20 LAB — LIPASE, BLOOD: Lipase: 42 U/L (ref 11–51)

## 2024-08-20 MED ORDER — IOHEXOL 300 MG/ML  SOLN
100.0000 mL | Freq: Once | INTRAMUSCULAR | Status: AC | PRN
  Administered 2024-08-20: 100 mL via INTRAVENOUS

## 2024-08-20 NOTE — ED Notes (Signed)
 Patient transported to CT

## 2024-08-20 NOTE — Discharge Instructions (Signed)
 You were evaluated in the Emergency Department and after careful evaluation, we did not find any emergent condition requiring admission or further testing in the hospital.  Your exam/testing today is overall reassuring.  Keep your follow-up with GI tomorrow to discuss your symptoms.  Please return to the Emergency Department if you experience any worsening of your condition.   Thank you for allowing us  to be a part of your care.

## 2024-08-20 NOTE — Progress Notes (Unsigned)
 Primary Care Physician:  Bertell Satterfield, MD Primary Gastroenterologist:  Dr. Shaaron  Pre-Procedure History & Physical: HPI:  Jeffrey Jarvis is a 28 y.o. male here for follow-up of chronic nausea and nonbloody diarrhea recently.  Patient has had diarrhea and nausea now for months.  He has been extensively evaluated here.  Workup has included colonoscopy with biopsies stool studies (GI PCR and C. difficile).  Gastric and small bowel biopsies negative for HP and celiac.  Negative alpha gal.  Could not intubate the TI on colonoscopy.  CRP normal.  He became alarmed recently when he had a bowel movement and looked in the toilet and he thought he saw a worm.  He has supplied a photograph which I will incorporate into his medical record.  He went to the ED last night had labs pretty much looked well and EDP could not find anything he had trouble submitting a stool GIP and open parasites were ordered but he has not submitted them to the lab as of yet.  He has not travel to any unusual places other than a cruise to the [place].  Although he has quite a bit of nausea.  He rarely vomits.  In fact he has gained almost 2 pounds since his last office visit weight in at our location.  Past Medical History:  Diagnosis Date   SVT (supraventricular tachycardia) [facility])     Past Surgical History:  Procedure Laterality Date   COLONOSCOPY N/A 04/08/2024   Procedure: COLONOSCOPY;  Surgeon: Shaaron Lamar HERO, MD;  Location: [facility];  Service: Endoscopy;  Laterality: N/A;  12:00pm, asa 2   ESOPHAGEAL DILATION N/A 04/08/2024   Procedure: DILATION, ESOPHAGUS;  Surgeon: Shaaron Lamar HERO, MD;  Location[facility];  Service: Endoscopy;  Laterality: N/A;   ESOPHAGOGASTRODUODENOSCOPY N/A 04/08/2024   Procedure: EGD (ESOPHAGOGASTRODUODENOSCOPY);  Surgeon: Shaaron Lamar HERO, MD;  Location: [facility];  Service: Endoscopy;  Laterality: N/A;   heart ablation      Prior to Admission medications   Not on File     Allergies as of 08/20/2024   (No Known Allergies)    Family History  Problem Relation Age of Onset   Colon cancer Paternal Aunt        not sure   Diabetes Paternal Aunt    Celiac disease Paternal Uncle    Inflammatory bowel disease Neg Hx     Social History   Socioeconomic History   Marital status: Significant Other    Spouse name: Not on file   Number of children: Not on file   Years of education: Not on file   Highest education level: Not on file  Occupational History   Not on file  Tobacco Use   Smoking status: Never   Smokeless tobacco: Never  Vaping Use   Vaping status: Never Used  Substance and Sexual Activity   Alcohol use: Yes    Comment: occ   Drug use: Never   Sexual activity: Not on file  Other Topics Concern   Not on file  Social History Narrative   Not on file   Social Drivers of Health   Financial Resource Strain: Not on file  Food Insecurity: Not on file  Transportation Needs: Not on file  Physical Activity: Not on file  Stress: Not on file  Social Connections: Not on file  Intimate Partner Violence: Not on file    Review of Systems: See HPI, otherwise negative ROS  Physical Exam: BP 106/67 (  BP Location: Right Arm, Patient Position: Sitting, Cuff Size: Normal)   Pulse (!) 52   Temp (!) 97.4 F (36.3 C) (Oral)   Ht 5' 10 (1.778 m)   Wt 129 lb 12.8 oz (58.9 kg)   SpO2 99%   BMI 18.62 kg/m  General:   Alert,  Well-developed, well-nourished, pleasant and cooperative in NAD Skin:  Intact without significant lesions or rashes. Neck:  Supple; no masses or thyromegaly. No significant cervical adenopathy. Lungs:  Clear throughout to auscultation.   No wheezes, crackles, or rhonchi. No acute distress. Heart:  Regular rate and rhythm; no murmurs, clicks, rubs,  or gallops. Abdomen: Non-distended, normal bowel sounds.  Soft and nontender without appreciable mass or hepatosplenomegaly.  Impression/Plan:   As discussed, you have had a very  extensive evaluation as the cause of your diarrhea and nausea that an obvious cause being found.  The photograph you supplied of your recent bowel movement is highly suspicious for helminth infection (probably a tapeworm).  Lets complete the stool studies particularly the ova and parasite assay to see if we can document this infection.  Certainly, there is treatment.  For nausea we will prescribe Zofran  4 mg orally disintegrating tablet dispense 30 with 2 refills  May use Imodium over-the-counter 2 mg orally up to 4 times daily as needed for loose stools  As soon as I get the test results back I will be in touch wit further recommendations as discussed if the stool studies do not give us  an answer, I would recommend we do a capsule study of your small intestine as the next step in the evaluation.  See photo in the medical record    Notice: This dictation was prepared with Dragon dictation along with smaller phrase technology. Any transcriptional errors that result from this process are unintentional and may not be corrected upon review.

## 2024-08-20 NOTE — Patient Instructions (Signed)
 It was nice to see you again today!  As discussed, you have had a very extensive evaluation as the cause of your diarrhea and nausea that an obvious cause being found.  The photograph you supplied of your recent bowel movement is highly suspicious for helminth infection (probably a tapeworm).  Lets complete the stool studies particularly the ova and parasite assay to see if we can document this infection.  Certainly, there is treatment.  For nausea we will prescribe Zofran  4 mg orally disintegrating tablet dispense 30 with 2 refills  May use Imodium over-the-counter 2 mg orally up to 4 times daily as needed for loose stools  As soon as I get the test results back I will be in touch wit further recommendations as discussed if the stool studies do not give us  an answer, I would recommend we do a capsule study of your small intestine as the next step in the evaluation.

## 2024-08-21 LAB — GASTROINTESTINAL PANEL BY PCR, STOOL (REPLACES STOOL CULTURE)

## 2024-08-22 ENCOUNTER — Other Ambulatory Visit: Payer: Self-pay

## 2024-08-22 MED ORDER — ONDANSETRON 4 MG PO TBDP: 4.0000 mg | ORAL_TABLET | Freq: Three times a day (TID) | ORAL | 2 refills | Status: DC | PRN

## 2024-08-28 LAB — GI PROFILE, STOOL, PCR

## 2024-08-28 LAB — CALPROTECTIN, FECAL: Calprotectin, Fecal: 55 ug/g (ref 0–120)

## 2024-08-29 ENCOUNTER — Ambulatory Visit: Payer: Self-pay | Admitting: Internal Medicine

## 2024-08-30 LAB — OVA AND PARASITE EXAMINATION

## 2024-09-06 LAB — OVA AND PARASITE EXAMINATION

## 2024-09-26 LAB — OVA AND PARASITE EXAMINATION

## 2024-11-06 NOTE — Progress Notes (Unsigned)
 GI Office Note    Referring Provider: Bertell Satterfield, MD Primary Care Physician:  Bertell Satterfield, MD Primary Gastroenterologist: Lamar HERO.Rourk, MD  Date:  11/07/2024  ID:  Jeffrey Jarvis, DOB November 25, 1996, MRN [id number]  Chief Complaint   Chief Complaint  Patient presents with   Follow-up    Follow up. Still having issues with is stomach. Nausea    History of Present Illness  Jeffrey Jarvis is a 28 y.o. male with a history of chronic nausea, nonbloody diarrhea presenting today with complaint of ongoing nausea.  GES normal in 2020.  EGD 2020: - Gastritis. Biopsied to check for H. pylori.  - The examination was otherwise normal.  - The normal appearing duodenum was biopsied to check for Celiac Sprue. - Path:  benign duodenal and gastric mucosa  At office visit in April 2025 Jeffrey reported that Jeffrey felt as though Jeffrey was able to eat better when Jeffrey was taking something for reflux.  Jeffrey was advised to start pantoprazole  40 mg once daily and was scheduled for upper endoscopy with dilation with Dr. Shaaron and considering colonoscopy.  Advised to follow a lactose-free diet as well.  Advised if things were normal then need to consider hepatobiliary workup.  EGD May 2025: - Normal esophagus. Dilated.  - Bile- stained gastric mucus. Status post gastric biopsy  - Normal duodenal bulb, second portion of the duodenum and third portion of the duodenum. - Path: reactive gastropathy, neg h. Pylori, nonspecific small bowel biopsies.  Colonoscopy May 2025: - The entire examined colon is normal. Status post segmental biopsy. - The distal rectum and anal verge are normal on retroflexion view.  - TI could not be intubated given angulation and diminutive ICV - Path: negative random biopsies  Prior workup with negative celiac serologies, H. Pylori negative, alpha gal negative, normal crp, normal stool studies including GIP and Cdiff  Last office visit 08/20/2024 with Dr. Shaaron. Having lots of nausea  but rare vomiting. Gained almost 2 lbs since last visit. Had a fairly recent ED visit where Jeffrey had concern about a BM and thought Jeffrey saw a worm. ED labs looked unremarkable and was unable to produce stool sample to test for GIP and O&P. Jeffrey had denied unusual travel other than a cruise to the [place]. Given zofran  for nausea, check O&Px3, advised imodium for diarrhea up to 4 times per day. If stool studies negative then recommended small bowel capsule.   O&P was negative x3. Fecal calprotectin borderline elevated.   Today:  Discussed the use of AI scribe software for clinical note transcription with the patient, who gave verbal consent to proceed.  Jeffrey has experienced persistent nausea since Jeffrey can remember, which worsens after eating. The nausea is severe, often leading to a sensation of fullness and bloating, but Jeffrey does not vomit. Certain foods, particularly sweet foods and sauces, exacerbate his symptoms. Jeffrey uses Zofran  as needed, which provides some relief, but the nausea persists, especially in the mornings.  Jeffrey has a history of gastrointestinal issues, including diarrhea that resolved after passing a worm-like object in his stool. Stool tests following this event did not reveal any eggs or parasites. Since then, his bowel movements have normalized, although Jeffrey occasionally experiences constipation, going several days without a bowel movement.  Jeffrey has undergone various diagnostic tests, including a colonoscopy and a gastric emptying study. A previous endoscopy showed bile in his stomach. Jeffrey has tried medications for acid reflux in the past, which provided temporary relief,  but his current medication regimen is the only one that has shown a noticeable difference.  Jeffrey experiences abdominal pain, particularly in the lower to mid-stomach area, and tenderness around the belly button and right side. This pain began after his bowel issues started. Jeffrey also reports frequent gas and bloating, with a  sensation of rumbling in his abdomen.  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 His symptoms are worse in the morning, making it difficult to eat breakfast immediately after waking up.       Wt Readings from Last 6 Encounters:  11/07/24 132 lb 3.2 oz (60 kg)  08/20/24 129 lb 12.8 oz (58.9 kg)  08/19/24 130 lb (59 kg)  04/08/24 130 lb (59 kg)  03/27/24 128 lb 12.8 oz (58.4 kg)  05/10/19 124 lb (56.2 kg)    Body mass index is 18.97 kg/m.   Current Outpatient Medications  Medication Sig Dispense Refill   ondansetron  (ZOFRAN -ODT) 4 MG disintegrating tablet Take 1 tablet (4 mg total) by mouth every 8 (eight) hours as needed for nausea or vomiting. 30 tablet 2   No current facility-administered medications for this visit.    Past Medical History:  Diagnosis Date   SVT (supraventricular tachycardia)     Past Surgical History:  Procedure Laterality Date   COLONOSCOPY N/A 04/08/2024   Procedure: COLONOSCOPY;  Surgeon: Shaaron Lamar HERO, MD;  Location[facility];  Service: Endoscopy;  Laterality: N/A;  12:00pm, asa 2   ESOPHAGEAL DILATION N/A 04/08/2024   Procedure: DILATION, ESOPHAGUS;  Surgeon: Shaaron Lamar HERO, MD;  Location[facility];  Service: Endoscopy;  Laterality: N/A;   ESOPHAGOGASTRODUODENOSCOPY N/A 04/08/2024   Procedure: EGD (ESOPHAGOGASTRODUODENOSCOPY);  Surgeon: Shaaron Lamar HERO, MD;  Location: [facility];  Service: Endoscopy;  Laterality: N/A;   heart ablation      Family History  Problem Relation Age of Onset   Colon cancer Paternal Aunt        not sure   Diabetes Paternal Aunt    Celiac disease Paternal Uncle    Inflammatory bowel disease Neg Hx     Allergies as of 11/07/2024   (No Known Allergies)    Social History   Socioeconomic History   Marital status: Significant Other    Spouse name: Not on file   Number of children: Not on file   Years of education: Not on file   Highest  education level: Not on file  Occupational History   Not on file  Tobacco Use   Smoking status: Never   Smokeless tobacco: Never  Vaping Use   Vaping status: Never Used  Substance and Sexual Activity   Alcohol use: Yes    Comment: occ   Drug use: Never   Sexual activity: Not on file  Other Topics Concern   Not on file  Social History Narrative   Not on file   Social Drivers of Health   Financial Resource Strain: Not on file  Food Insecurity: Not on file  Transportation Needs: Not on file  Physical Activity: Not on file  Stress: Not on file  Social Connections: Not on file    Review of Systems   Gen: Denies fever, chills, anorexia. Denies fatigue, weakness, weight loss.  CV: Denies chest pain, palpitations, syncope, peripheral edema, and claudication. Resp: Denies dyspnea at rest, cough, wheezing, coughing up blood, and pleurisy. GI: See HPI Derm: Denies rash, itching, dry skin Psych: Denies  depression, anxiety, memory loss, confusion. No homicidal or suicidal ideation.  Heme: Denies bruising, bleeding, and enlarged lymph nodes.  Physical Exam   BP 94/68 (BP Location: Right Arm, Patient Position: Sitting, Cuff Size: Large)   Pulse (!) 59   Temp 97.8 F (36.6 C) (Temporal)   Ht 5' 10 (1.778 m)   Wt 132 lb 3.2 oz (60 kg)   BMI 18.97 kg/m   General:   Alert and oriented. No distress noted. Pleasant and cooperative.  Head:  Normocephalic and atraumatic. Eyes:  Conjuctiva clear without scleral icterus. Mouth:  Oral mucosa pink and moist. Good dentition. No lesions. Abdomen:  +BS, soft, non-tender and non-distended. Flat abdomen. Ttp to epigastrium, umbilucus, and right mid abdomen. No rebound or guarding. No HSM or masses noted. Rectal: deferred Msk:  Symmetrical without gross deformities. Normal posture. Extremities:  Without edema. Neurologic:  Alert and  oriented x4 Psych:  Alert and cooperative. Normal mood and affect.  Assessment & Plan  ARGIE APPLEGATE is  a 28 y.o. male presenting today with ongogin nausea and abdominal pain after meals associated with early satiety.     Chronic nausea and abdominal pain with bloating and early satiety Chronic nausea and abdominal pain with bloating and early satiety, exacerbated by meals. Symptoms include nausea, bloating, and early satiety, with occasional abdominal pain. Previous gastric emptying study was normal. Differential diagnosis includes gallbladder dysfunction, small intestinal bacterial overgrowth (SIBO), dietary intolerances (sucrose, lactose, glucose), and adrenal insufficiency. Symptoms are cyclical, with periods of improvement and exacerbation. Current management with Zofran  provides partial relief. No significant weight loss noted. - Ordered breath test for SIBO and other gas-producing bacteria. - Ordered HIDA scan to evaluate gallbladder function. - Provided low FODMAP diet list for dietary modification. - Continue Zofran  as needed for nausea. - Will consider adrenal insufficiency testing if gallbladder and SIBO tests are negative. - Will discuss potential use of amitriptyline or Buspar if symptoms persist and are not related to gallbladder or SIBO.  Gastroesophageal reflux disease Previous treatment with pantoprazole  providing limited relief. Symptoms include nausea and abdominal discomfort, possibly related to reflux. Current management with Zofran  provides partial relief. Consideration of stronger reflux medication if gallbladder and SIBO tests are negative. - Will consider trial of stronger reflux medication if gallbladder and SIBO tests are negative.  Reactive gastropathy Previous biopsy showing reactive gastropathy with thinning of the mucosal layer and increased blood vessels. Symptoms include abdominal pain and tenderness, particularly in the lower to mid stomach area. Previous treatment with pantoprazole  provided limited relief and currently not on any alternative. Consideration of stronger  reflux medication if gallbladder and SIBO tests are negative. - Will consider trial of stronger reflux medication if gallbladder and SIBO tests are negative.      Follow up   Follow up 2 months    Charmaine Melia, MSN, FNP-BC, AGACNP-BC [facility]

## 2024-11-07 ENCOUNTER — Encounter: Payer: Self-pay | Admitting: Gastroenterology

## 2024-11-07 ENCOUNTER — Ambulatory Visit: Admitting: Gastroenterology

## 2024-11-07 ENCOUNTER — Encounter: Payer: Self-pay | Admitting: *Deleted

## 2024-11-07 ENCOUNTER — Other Ambulatory Visit: Payer: Self-pay | Admitting: *Deleted

## 2024-11-07 VITALS — BP 94/68 | HR 59 | Temp 97.8°F | Ht 70.0 in | Wt 132.2 lb

## 2024-11-07 DIAGNOSIS — R110 Nausea: Secondary | ICD-10-CM

## 2024-11-07 DIAGNOSIS — R109 Unspecified abdominal pain: Secondary | ICD-10-CM | POA: Diagnosis not present

## 2024-11-07 DIAGNOSIS — R1010 Upper abdominal pain, unspecified: Secondary | ICD-10-CM

## 2024-11-07 DIAGNOSIS — R1030 Lower abdominal pain, unspecified: Secondary | ICD-10-CM

## 2024-11-07 DIAGNOSIS — R6881 Early satiety: Secondary | ICD-10-CM

## 2024-11-07 DIAGNOSIS — K219 Gastro-esophageal reflux disease without esophagitis: Secondary | ICD-10-CM

## 2024-11-07 DIAGNOSIS — R140 Abdominal distension (gaseous): Secondary | ICD-10-CM | POA: Diagnosis not present

## 2024-11-07 DIAGNOSIS — K3189 Other diseases of stomach and duodenum: Secondary | ICD-10-CM

## 2024-11-07 MED ORDER — ONDANSETRON 4 MG PO TBDP: 4.0000 mg | ORAL_TABLET | Freq: Three times a day (TID) | ORAL | 2 refills | Status: AC | PRN

## 2024-11-07 NOTE — Patient Instructions (Signed)
  VISIT SUMMARY: You came in today because of persistent nausea and gastrointestinal symptoms that have been troubling you for a long time. We discussed your history of nausea, abdominal pain, bloating, and early fullness after eating. We also reviewed your previous tests and treatments.  YOUR PLAN: -CHRONIC NAUSEA AND ABDOMINAL PAIN WITH BLOATING AND EARLY SATIETY: This condition involves ongoing nausea, stomach pain, bloating, and feeling full quickly after eating. We will conduct a breath test to check for bacterial overgrowth in your small intestine and a HIDA scan to evaluate your gallbladder function. You should follow a low FODMAP diet to see if it helps with your symptoms. Continue taking Zofran  as needed for nausea. If the tests are negative, we may test for adrenal insufficiency and consider medications like amitriptyline or Buspar.  -GASTROESOPHAGEAL REFLUX DISEASE: This condition involves stomach acid flowing back into the esophagus, causing discomfort and nausea. If the gallbladder and bacterial overgrowth tests are negative, we may try a stronger medication for acid reflux.  -REACTIVE GASTROPATHY: This condition involves inflammation of the stomach lining, which can cause pain and tenderness. If the gallbladder and bacterial overgrowth tests are negative, we may try a stronger medication for acid reflux.  INSTRUCTIONS: Please complete the breath test for small intestinal bacterial overgrowth and the HIDA scan as soon as possible. Follow the low FODMAP diet provided and continue taking Zofran  as needed for nausea. We will discuss further treatment options based on your test results.  It was a pleasure to see you today. I want to create trusting relationships with patients. If you receive a survey regarding your visit,  I greatly appreciate you taking time to fill this out on paper or through your MyChart. I value your feedback.  Charmaine Melia, MSN, FNP-BC, AGACNP-BC [facility]

## 2024-11-07 NOTE — Progress Notes (Signed)
 [employer] PA: CPT Code 21772 Description: [facility] W/DRUG Case Number: [id number] Review Date: 11/07/2024 1:08:41 PM Expiration Date: N/A Status: This member's plan does not currently require notification or prior-authorization through the [employer] Notification or Prior-Authorization Program. Please contact a Customer Care Professional at [phone] if you believe the information returned to be in error.

## 2024-11-12 ENCOUNTER — Ambulatory Visit (HOSPITAL_COMMUNITY)

## 2024-11-15 ENCOUNTER — Encounter (HOSPITAL_COMMUNITY): Payer: Self-pay

## 2024-11-15 ENCOUNTER — Ambulatory Visit (HOSPITAL_COMMUNITY): Admission: RE | Admit: 2024-11-15 | Discharge: 2024-11-15 | Attending: Gastroenterology

## 2024-11-15 DIAGNOSIS — R1030 Lower abdominal pain, unspecified: Secondary | ICD-10-CM

## 2024-11-15 DIAGNOSIS — R1010 Upper abdominal pain, unspecified: Secondary | ICD-10-CM

## 2024-11-15 DIAGNOSIS — R110 Nausea: Secondary | ICD-10-CM

## 2024-11-15 MED ORDER — TECHNETIUM TC 99M MEBROFENIN IV KIT
5.0000 | PACK | Freq: Once | INTRAVENOUS | Status: AC | PRN
  Administered 2024-11-15: 5.1 via INTRAVENOUS

## 2024-11-18 ENCOUNTER — Ambulatory Visit: Payer: Self-pay | Admitting: Gastroenterology

## 2024-12-14 ENCOUNTER — Ambulatory Visit
Admission: EM | Admit: 2024-12-14 | Discharge: 2024-12-14 | Disposition: A | Discharge status: Home / Self Care | Attending: Family Medicine | Admitting: Family Medicine

## 2024-12-14 DIAGNOSIS — J358 Other chronic diseases of tonsils and adenoids: Secondary | ICD-10-CM | POA: Diagnosis not present

## 2024-12-14 LAB — POCT RAPID STREP A (OFFICE): Rapid Strep A Screen: NEGATIVE

## 2024-12-14 NOTE — ED Triage Notes (Signed)
 Pt states sore throat with white spots on his tonsils for the past 3 weeks.

## 2024-12-14 NOTE — Discharge Instructions (Signed)
 Your strep test was today and your exam is reassuring.  I suspect tonsil stones to be causing your intermittent symptoms.  Stay well-hydrated, practice good oral hygiene and follow-up with primary care if becoming persistent and incredibly bothersome.

## 2024-12-14 NOTE — ED Provider Notes (Signed)
 " [facility]    CSN: [id number] Arrival date & time: 12/14/24  9057      History   Chief Complaint Chief Complaint  Patient presents with   Sore Throat    HPI Jeffrey Jarvis is a 29 y.o. male.   Patient presenting today with 3-week history of intermittent episodes of throat irritation and hard white flecks being expressed from his tonsils.  Has had 2 episodes of being able to extract little hard white balls from bilateral tonsils.  Denies difficulty breathing or swallowing, fever, chills, rashes, new foods or medications.  So far not trying anything over-the-counter for symptoms.    Past Medical History:  Diagnosis Date   SVT (supraventricular tachycardia)     Patient Active Problem List   Diagnosis Date Noted   Nausea without vomiting 03/27/2024   Diarrhea 03/27/2024   Dysphagia 03/27/2024    Past Surgical History:  Procedure Laterality Date   COLONOSCOPY N/A 04/08/2024   Procedure: COLONOSCOPY;  Surgeon: Shaaron Lamar HERO, MD;  Location: [facility];  Service: Endoscopy;  Laterality: N/A;  12:00pm, asa 2   ESOPHAGEAL DILATION N/A 04/08/2024   Procedure: DILATION, ESOPHAGUS;  Surgeon: Shaaron Lamar HERO, MD;  Location: [facility];  Service: Endoscopy;  Laterality: N/A;   ESOPHAGOGASTRODUODENOSCOPY N/A 04/08/2024   Procedure: EGD (ESOPHAGOGASTRODUODENOSCOPY);  Surgeon: Shaaron Lamar HERO, MD;  Location: [facility];  Service: Endoscopy;  Laterality: N/A;   heart ablation         Home Medications    Prior to Admission medications  Medication Sig Start Date End Date Taking? Authorizing Provider  ondansetron  (ZOFRAN -ODT) 4 MG disintegrating tablet Take 1 tablet (4 mg total) by mouth every 8 (eight) hours as needed for nausea. 11/07/24   Kennedy Charmaine CROME, NP    Family History Family History  Problem Relation Age of Onset   Colon cancer Paternal Aunt        not sure   Diabetes Paternal Aunt    Celiac disease Paternal Uncle    Inflammatory bowel disease  Neg Hx     Social History Social History[1]   Allergies   Patient has no known allergies.   Review of Systems Review of Systems PER HPI  Physical Exam Triage Vital Signs ED Triage Vitals  Encounter Vitals Group     BP 12/14/24 0954 101/65     Girls Systolic BP Percentile --      Girls Diastolic BP Percentile --      Boys Systolic BP Percentile --      Boys Diastolic BP Percentile --      Pulse Rate 12/14/24 0954 (!) 55     Resp 12/14/24 0954 16     Temp 12/14/24 0954 97.6 F (36.4 C)     Temp Source 12/14/24 0954 Oral     SpO2 12/14/24 0954 95 %     Weight --      Height --      Head Circumference --      Peak Flow --      Pain Score 12/14/24 0953 2     Pain Loc --      Pain Education --      Exclude from Growth Chart --    No data found.  Updated Vital Signs BP 101/65 (BP Location: Right Arm)   Pulse (!) 55   Temp 97.6 F (36.4 C) (Oral)   Resp 16   SpO2 95%   Visual Acuity Right Eye Distance:  Left Eye Distance:   Bilateral Distance:    Right Eye Near:   Left Eye Near:    Bilateral Near:     Physical Exam Vitals and nursing note reviewed.  Constitutional:      Appearance: Normal appearance.  HENT:     Head: Atraumatic.     Nose: Nose normal.     Mouth/Throat:     Mouth: Mucous membranes are moist.     Pharynx: Oropharynx is clear. No oropharyngeal exudate or posterior oropharyngeal erythema.  Eyes:     Extraocular Movements: Extraocular movements intact.     Conjunctiva/sclera: Conjunctivae normal.  Cardiovascular:     Rate and Rhythm: Normal rate.  Pulmonary:     Effort: Pulmonary effort is normal.  Musculoskeletal:        General: Normal range of motion.     Cervical back: Normal range of motion and neck supple.  Skin:    General: Skin is warm and dry.  Neurological:     Mental Status: He is oriented to person, place, and time.  Psychiatric:        Mood and Affect: Mood normal.        Thought Content: Thought content normal.         Judgment: Judgment normal.      UC Treatments / Results  Labs (all labs ordered are listed, but only abnormal results are displayed) Labs Reviewed  POCT RAPID STREP A (OFFICE)    EKG   Radiology No results found.  Procedures Procedures (including critical care time)  Medications Ordered in UC Medications - No data to display  Initial Impression / Assessment and Plan / UC Course  I have reviewed the triage vital signs and the nursing notes.  Pertinent labs & imaging results that were available during my care of the patient were reviewed by me and considered in my medical decision making (see chart for details).     Vitals and exam reassuring today, rapid strep negative.  Suspect tonsil stones to be causing his symptoms.  Discussed good oral hygiene, drinking plenty of fluids, return precautions.  Final Clinical Impressions(s) / UC Diagnoses   Final diagnoses:  Tonsil stone     Discharge Instructions      Your strep test was today and your exam is reassuring.  I suspect tonsil stones to be causing your intermittent symptoms.  Stay well-hydrated, practice good oral hygiene and follow-up with primary care if becoming persistent and incredibly bothersome.    ED Prescriptions   None    PDMP not reviewed this encounter.    [1]  Social History Tobacco Use   Smoking status: Never   Smokeless tobacco: Never  Vaping Use   Vaping status: Never Used  Substance Use Topics   Alcohol use: Yes    Comment: occ   Drug use: Never     Stuart Vernell Norris, PA-C 12/14/24 1101  "
